# Patient Record
Sex: Female | Born: 1980 | Race: Black or African American | Hispanic: No | Marital: Married | Smoking: Never smoker
Health system: Southern US, Community
[De-identification: ages and names within clinical notes are randomized; demographics above are authoritative.]

## PROBLEM LIST (undated history)

## (undated) DIAGNOSIS — I10 Essential (primary) hypertension: Secondary | ICD-10-CM

## (undated) HISTORY — PX: DERMOID CYST  EXCISION: SHX1452

## (undated) HISTORY — PX: MR HIPS BILATERAL: HXRAD169

---
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
## 2001-08-26 ENCOUNTER — Inpatient Hospital Stay (HOSPITAL_COMMUNITY): Admission: AD | Admit: 2001-08-26 | Discharge: 2001-08-28 | Payer: Self-pay | Admitting: Obstetrics and Gynecology

## 2002-09-18 ENCOUNTER — Other Ambulatory Visit: Admission: RE | Admit: 2002-09-18 | Discharge: 2002-09-18 | Payer: Self-pay | Admitting: Obstetrics and Gynecology

## 2003-10-17 ENCOUNTER — Emergency Department (HOSPITAL_COMMUNITY): Admission: EM | Admit: 2003-10-17 | Discharge: 2003-10-17 | Payer: Self-pay | Admitting: Family Medicine

## 2003-12-05 ENCOUNTER — Other Ambulatory Visit: Admission: RE | Admit: 2003-12-05 | Discharge: 2003-12-05 | Payer: Self-pay | Admitting: Obstetrics and Gynecology

## 2004-02-01 ENCOUNTER — Emergency Department (HOSPITAL_COMMUNITY): Admission: EM | Admit: 2004-02-01 | Discharge: 2004-02-01 | Payer: Self-pay | Admitting: Family Medicine

## 2004-02-14 ENCOUNTER — Emergency Department (HOSPITAL_COMMUNITY): Admission: EM | Admit: 2004-02-14 | Discharge: 2004-02-14 | Payer: Self-pay | Admitting: Emergency Medicine

## 2004-02-15 ENCOUNTER — Emergency Department (HOSPITAL_COMMUNITY): Admission: EM | Admit: 2004-02-15 | Discharge: 2004-02-15 | Payer: Self-pay | Admitting: Emergency Medicine

## 2005-03-18 ENCOUNTER — Emergency Department (HOSPITAL_COMMUNITY): Admission: EM | Admit: 2005-03-18 | Discharge: 2005-03-18 | Payer: Self-pay | Admitting: Family Medicine

## 2005-09-09 ENCOUNTER — Emergency Department (HOSPITAL_COMMUNITY): Admission: EM | Admit: 2005-09-09 | Discharge: 2005-09-09 | Payer: Self-pay | Admitting: Family Medicine

## 2007-10-28 ENCOUNTER — Inpatient Hospital Stay (HOSPITAL_COMMUNITY): Admission: AD | Admit: 2007-10-28 | Discharge: 2007-10-28 | Payer: Self-pay | Admitting: Obstetrics and Gynecology

## 2007-10-30 ENCOUNTER — Encounter: Admission: RE | Admit: 2007-10-30 | Discharge: 2007-10-30 | Payer: Self-pay | Admitting: Obstetrics and Gynecology

## 2007-12-15 ENCOUNTER — Inpatient Hospital Stay (HOSPITAL_COMMUNITY): Admission: AD | Admit: 2007-12-15 | Discharge: 2007-12-16 | Payer: Self-pay | Admitting: Obstetrics and Gynecology

## 2007-12-18 ENCOUNTER — Inpatient Hospital Stay (HOSPITAL_COMMUNITY): Admission: AD | Admit: 2007-12-18 | Discharge: 2007-12-18 | Payer: Self-pay | Admitting: Obstetrics and Gynecology

## 2008-03-13 ENCOUNTER — Inpatient Hospital Stay (HOSPITAL_COMMUNITY): Admission: AD | Admit: 2008-03-13 | Discharge: 2008-03-13 | Payer: Self-pay | Admitting: Obstetrics and Gynecology

## 2008-04-18 ENCOUNTER — Inpatient Hospital Stay (HOSPITAL_COMMUNITY): Admission: AD | Admit: 2008-04-18 | Discharge: 2008-04-18 | Payer: Self-pay | Admitting: Obstetrics and Gynecology

## 2008-04-23 ENCOUNTER — Inpatient Hospital Stay (HOSPITAL_COMMUNITY): Admission: AD | Admit: 2008-04-23 | Discharge: 2008-04-25 | Payer: Self-pay | Admitting: Obstetrics and Gynecology

## 2008-09-09 ENCOUNTER — Encounter: Admission: RE | Admit: 2008-09-09 | Discharge: 2008-09-09 | Payer: Self-pay | Admitting: Obstetrics and Gynecology

## 2010-09-21 NOTE — H&P (Signed)
[name], [name]                  ACCOUNT NO.:  [id number]   MEDICAL RECORD NO.:  [id number]          PATIENT TYPE:  INP   LOCATION:  9135                          FACILITY:  [facility]   PHYSICIAN:  Hal Morales, M.D.DATE OF BIRTH:  Nov 21, 1980   DATE OF ADMISSION:  04/23/2008  DATE OF DISCHARGE:                              HISTORY & PHYSICAL   HISTORY OF PRESENT ILLNESS:  Brenda Johns is a 30 year old G2, P 1-0-0-1 at  24 weeks who presents today with report of increased contractions and  pelvic pressure and pain.  She has been followed by the Midwifery  Service at [facility] and Gynecology.  Her pregnancy is remarkable for,  1. History of anemia.  2. 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  3. Polydactyly  4. Obesity with current weight of 296 pounds.  5. Uterine fibroids and dermoids.   PRENATAL LABS:  When she entered the practice in May, her hemoglobin was  11.3, her hematocrit was 34.1, and platelets 230,000.  Blood type B  positive, antibody screen negative, sickle cell trait screen negative,  RPR nonreactive, hepatitis B negative, and HIV nonreactive.  Pap was  normal.  Gonorrhea and Chlamydia cultures were negative.  At 27 weeks 5  days, she had a Glucola and 1-hour Glucola that was 134, at that time  her hemoglobin was 9.6 and she was begun on iron.  Her beta strep test  was negative on April 01, 2008, at 35 weeks and 6 days.  Brenda Johns  entered prenatal care at [facility] beginning with an interview  in the beginning of May, on Sep 13, 2007 and was started on prenatal  vitamins at that time.  On Oct 04, 2007 she had her new OB exam and she  was 10 weeks at that time.  She began to have a lot of pain in her  pelvis at the beginning of second trimester in June.  Third week of  June, she was seen in the MAU at the hospital for pain and cramping, had  an ultrasound that showed intrauterine pregnancy and abdominal masses,  an adnexal complex, and an umbilical complex.   She had an MRI, to follow  up the same and some consultation was done.  Her pain proceeded to  worsen and then at the end of July, she was taking narcotics, oxycodone.  The masses were getting larger.  She was referred out to Dr. April Manson  by Dr. Pennie Rushing and she had  robotic surgery to remove dermoid cysts that  measured 12 cm x 7 cm.  By the middle of August, she was feeling much  better after the cystectomy, she did have some cramping and a negative  fetal fibronectin, that was done.  In the beginning of September, she  had another ultrasound secondary to size greater than dates and the  growth showed size consistent with date and estimated fetal weight of 1  pound 9 ounces, cervix 5.7 cm, placenta anterior grade 1, normal fluid.  She had her 1-hour Glucola at the end of September at a shy of  28 weeks.  Normal finding of 134 mg/dL concurrent hemoglobin of 9.6.  She was  started on iron at that time.  She had another ultrasound in the middle  October, just shy of 30 weeks.  Estimated fetal weight on 4 pounds, 1  ounce greater than 90th percentile,  AFI 50th percentile.  She got her  H1N1 shot in October.  She continued to have some problems with  contractions and irritability and was taking some terbutaline every 4  hours as needed  A GBS was done on April 01, 2008 and was negative.  Over the past month, she has been having a lot of irregular contractions  and pelvic pain and has had several false alarms.   OBSTETRICAL HISTORY:  Gravida 1, was a daughter born in April 2003 at [redacted]  weeks gestation weighing 7 pounds 3 ounces after 6 hours of labor,  spontaneous vaginal delivery with no complications.  Gravida 2 is  current pregnancy.   ALLERGIES:  The patient has no known allergies.   MEDICATIONS:  She is on prenatal vitamins and iron.   MEDICAL HISTORY:  She began menstruation at age 80 with cycles occurring  every 28 days and lasting as long as 7 days with moderate-to-heavy  flow.  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   FAMILY HISTORY:  Extra digit as previously mentioned and she has a  cousin with Down syndrome.   GENETIC HISTORY:  Sickle screen negative in October, polydactyly, and  Down syndrome. No other contributing information for genetic history.   SOCIAL HISTORY:  She is married to the father of the baby who's name  [employer].  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  Father of the baby also has a high school diploma and works for  a [employer] stream[employer].  She denies the use of alcohol, tobacco, or street  drugs during the pregnancy.  Declines to stay to religious preference.  Race is African American.   PHYSICAL EXAMINATION:  VITAL SIGNS:  Stable and within normal limits.  HEENT:  Within normal limits.  LUNGS:  Clear to auscultation bilaterally.  HEART:  Regular rate and rhythm.  No murmurs heard.  BREASTS:  Soft.  ABDOMEN:  Very large gravid, large for dates with a fundal height of 43  cm.  Soft to palpation between contractions.  Fetal heart rate normal  and reassuring, reactive.  EXTREMITIES:  No edema of upper extremities, but +1 edema of bilateral  lower extremities.  DTRs +1/+1.  Negative clonus and Homans.  CERVIX:  6 cm, 60% effaced, -2 station with a bulging bag of water,  vertex presentation.   IMPRESSION:  Active labor.   PLAN:  Admit.  Routine orders.  MD consultation.  Anticipate NSVD.      Eulogio Bear, CNM      Hal Morales, M.D.  Electronically Signed    JM/MEDQ  D:  04/23/2008  T:  04/24/2008  Job:  [id number]

## 2010-09-24 NOTE — H&P (Signed)
[facility]  Patient:    Brenda Johns, Brenda Johns Visit Number: [id number] MRN: [id number]          Service Type: OBS Location: 910A 9135 01 Attending Physician:  Esmeralda Arthur Dictated by:   Saverio Danker, C.N.M. Admit Date:  08/26/2001                           History and Physical  HISTORY OF PRESENT ILLNESS:   [name] is a 30 year old single black female, gravida 1, para 0, at 39-4/7ths weeks, who presents complaining of uterine contractions every 2-3 minutes for the last 2-3 hours.  She denies any leaking, but has seen some bloody show.  She denies any nausea, vomiting, headaches or visual disturbances.  Her pregnancy has been followed at [facility] by the certified nurse midwife service and has been essentially uncomplicated, though at risk for: 1. Positive group B strep. 2. History of bilateral hip pins.  She denies an epidural for labor.  OBSTETRICAL/GYNECOLOGICAL HISTORY:                      Her LMP is November 20, 2000, giving her an [facility] of August 29, 2001, confirmed by ultrasound.  She is a primigravida.  Her other OB/GYN history: She conceived while on oral contraceptives.  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  GENERAL MEDICAL HISTORY:      She has no known drug allergies.  She reports having had the usual childhood diseases.  She reports a history of anemia and her only surgery was hip pins placed bilaterally she reports secondary to obesity.  FAMILY HISTORY:               Significant for father, maternal grandmother and paternal grandfather with hypertension; maternal grandfather with emphysema; maternal aunt and paternal grandfather with diabetes; her mother with thyroid disease.  GENETIC HISTORY:              Significant only for a cousin with clubbed feet.  SOCIAL HISTORY:               She is single.  The father of the baby is [employer].  He is involved and supportive.  She is  employed full time at [employer].  He is employed full time in [employer].  They deny any illicit drug use, alcohol or smoking with this pregnancy.  PRENATAL LABORATORIES:        Her blood type is B positive.  Her antibody screen is negative.  Sickle cell trait is negative.  Syphilis is nonreactive. Rubella is positive.  Hepatitis B surface antigen is negative.  HIV is nonreactive.  GC and Chlamydia are both negative.  Her Pap is within normal limits.  Her one-hour Glucola was 130 and maternal serum alpha-fetoprotein was within normal range and her 36-week beta strep was positive.  PHYSICAL EXAMINATION:  VITAL SIGNS:                  Her vital signs are stable, she is afebrile.  HEENT:                        Grossly within normal limits.  HEART:                        Regular rhythm and rate.  CHEST:  Clear.  BREASTS:                      Soft and nontender.  ABDOMEN:                      Gravid with uterine contractions every 2-3 minutes.  Her fetal heart rate is reassuring.  PELVIC:                       Her cervix is 5 cm, 100%, vertex at 0 station with intact membranes.  EXTREMITIES:                  Within normal limits.  ASSESSMENT:                   1. Intrauterine pregnancy at term.                               2. Active labor.                               3. Positive group B Streptococcus.                               4. Patient desires epidural.  PLAN:                         Admit to labor and delivery, to follow routine CNM orders, Dr. Estanislado Pandy has been notified of patients admission.  The patient is to receive penicillin for group B and an epidural for labor. Dictated by:   Vance Gather Duplantis, C.N.M. Attending Physician:  Esmeralda Arthur DD:  08/26/01 TD:  08/26/01 Job: [id number]

## 2010-11-04 ENCOUNTER — Other Ambulatory Visit (HOSPITAL_COMMUNITY)
Admission: RE | Admit: 2010-11-04 | Discharge: 2010-11-04 | Disposition: A | Discharge status: Home / Self Care | Payer: BC Managed Care – PPO | Source: Ambulatory Visit | Attending: Obstetrics and Gynecology | Admitting: Obstetrics and Gynecology

## 2010-11-04 ENCOUNTER — Other Ambulatory Visit: Payer: Self-pay | Admitting: Nurse Practitioner

## 2010-11-04 DIAGNOSIS — Z1159 Encounter for screening for other viral diseases: Secondary | ICD-10-CM | POA: Insufficient documentation

## 2010-11-04 DIAGNOSIS — Z124 Encounter for screening for malignant neoplasm of cervix: Secondary | ICD-10-CM | POA: Insufficient documentation

## 2011-02-03 LAB — URINE MICROSCOPIC-ADD ON

## 2011-02-03 LAB — CBC
HCT: 32.7 — ABNORMAL LOW
Hemoglobin: 11.3 — ABNORMAL LOW
MCHC: 34.6
MCV: 84.3
Platelets: 204
RBC: 3.88
RDW: 15.3
WBC: 7.3

## 2011-02-03 LAB — URINALYSIS, ROUTINE W REFLEX MICROSCOPIC
Glucose, UA: NEGATIVE
Hgb urine dipstick: NEGATIVE
Ketones, ur: 15 — AB
Leukocytes, UA: NEGATIVE
Nitrite: NEGATIVE
Protein, ur: 30 — AB
Specific Gravity, Urine: >1.03 — ABNORMAL HIGH
Urobilinogen, UA: 0.2
pH: 6.5

## 2011-02-03 LAB — BASIC METABOLIC PANEL
BUN: 4 — ABNORMAL LOW
CO2: 23
Calcium: 8.9
Chloride: 106
Creatinine, Ser: 0.47
GFR calc Af Amer: >60
GFR calc non Af Amer: >60
Glucose, Bld: 106 — ABNORMAL HIGH
Potassium: 3.8
Sodium: 136

## 2011-02-04 LAB — COMPREHENSIVE METABOLIC PANEL
ALT: 20
AST: 15
Albumin: 2.1 — ABNORMAL LOW
Alkaline Phosphatase: 51
BUN: 3 — ABNORMAL LOW
CO2: 25
Calcium: 8.1 — ABNORMAL LOW
Chloride: 107
Creatinine, Ser: 0.52
GFR calc Af Amer: >60
GFR calc non Af Amer: >60
Glucose, Bld: 81
Potassium: 3.6
Sodium: 137
Total Bilirubin: 0.5
Total Protein: 5.1 — ABNORMAL LOW

## 2011-02-04 LAB — URINE CULTURE: Colony Count: >=100000

## 2011-02-04 LAB — URINALYSIS, ROUTINE W REFLEX MICROSCOPIC
Bilirubin Urine: NEGATIVE
Bilirubin Urine: NEGATIVE
Glucose, UA: 100 — AB
Glucose, UA: NEGATIVE
Hgb urine dipstick: NEGATIVE
Hgb urine dipstick: NEGATIVE
Ketones, ur: NEGATIVE
Ketones, ur: NEGATIVE
Nitrite: NEGATIVE
Nitrite: NEGATIVE
Protein, ur: NEGATIVE
Protein, ur: NEGATIVE
Specific Gravity, Urine: 1.01
Specific Gravity, Urine: 1.02
Urobilinogen, UA: 1
Urobilinogen, UA: 2 — ABNORMAL HIGH
pH: 6.5
pH: 7

## 2011-02-04 LAB — CBC
HCT: 27.5 — ABNORMAL LOW
Hemoglobin: 9.4 — ABNORMAL LOW
MCHC: 34.1
MCV: 86.6
Platelets: 216
RBC: 3.18 — ABNORMAL LOW
RDW: 14.2
WBC: 7.4

## 2011-02-04 LAB — DIFFERENTIAL
Basophils Absolute: 0.1
Basophils Relative: 2 — ABNORMAL HIGH
Eosinophils Absolute: 0.1
Eosinophils Relative: 1
Lymphocytes Relative: 22
Lymphs Abs: 1.6
Monocytes Absolute: 0.6
Monocytes Relative: 8
Neutro Abs: 5.1
Neutrophils Relative %: 68

## 2011-02-08 LAB — URINALYSIS, ROUTINE W REFLEX MICROSCOPIC
Bilirubin Urine: NEGATIVE
Glucose, UA: NEGATIVE
Hgb urine dipstick: NEGATIVE
Ketones, ur: NEGATIVE
Nitrite: NEGATIVE
Protein, ur: NEGATIVE
Specific Gravity, Urine: 1.025
Urobilinogen, UA: 0.2
pH: 6

## 2011-02-08 LAB — FETAL FIBRONECTIN: Fetal Fibronectin: NEGATIVE

## 2011-02-08 LAB — STREP B DNA PROBE: Strep Group B Ag: NEGATIVE

## 2011-02-08 LAB — GC/CHLAMYDIA PROBE AMP, GENITAL
Chlamydia, DNA Probe: NEGATIVE
GC Probe Amp, Genital: NEGATIVE

## 2011-02-08 LAB — WET PREP, GENITAL
Clue Cells Wet Prep HPF POC: NONE SEEN
Trich, Wet Prep: NONE SEEN
Yeast Wet Prep HPF POC: NONE SEEN

## 2011-02-10 LAB — CBC
HCT: 27.9 % — ABNORMAL LOW (ref 36.0–46.0)
HCT: 30.4 % — ABNORMAL LOW (ref 36.0–46.0)
Hemoglobin: 10.4 g/dL — ABNORMAL LOW (ref 12.0–15.0)
Hemoglobin: 9.4 g/dL — ABNORMAL LOW (ref 12.0–15.0)
MCHC: 33.6 g/dL (ref 30.0–36.0)
MCHC: 34.2 g/dL (ref 30.0–36.0)
MCV: 79.1 fL (ref 78.0–100.0)
MCV: 79.8 fL (ref 78.0–100.0)
Platelets: 223 10*3/uL (ref 150–400)
Platelets: 247 10*3/uL (ref 150–400)
RBC: 3.5 MIL/uL — ABNORMAL LOW (ref 3.87–5.11)
RBC: 3.84 MIL/uL — ABNORMAL LOW (ref 3.87–5.11)
RDW: 15.5 % (ref 11.5–15.5)
RDW: 15.5 % (ref 11.5–15.5)
WBC: 11.7 10*3/uL — ABNORMAL HIGH (ref 4.0–10.5)
WBC: 6.7 10*3/uL (ref 4.0–10.5)

## 2011-02-10 LAB — RPR: RPR Ser Ql: NONREACTIVE

## 2011-03-01 ENCOUNTER — Encounter: Payer: BC Managed Care – PPO | Admitting: Hematology and Oncology

## 2011-03-10 ENCOUNTER — Telehealth: Payer: Self-pay | Admitting: Hematology and Oncology

## 2011-03-10 NOTE — Telephone Encounter (Signed)
Pt work phone number is [phone].

## 2011-03-10 NOTE — Telephone Encounter (Signed)
S/w pt today re appt for 11/6 @ 2:15 pm w/LO.

## 2011-03-15 ENCOUNTER — Ambulatory Visit: Payer: BC Managed Care – PPO | Admitting: Hematology and Oncology

## 2011-03-15 ENCOUNTER — Ambulatory Visit: Payer: BC Managed Care – PPO

## 2013-02-19 ENCOUNTER — Encounter (HOSPITAL_COMMUNITY): Payer: Self-pay | Admitting: Emergency Medicine

## 2013-02-19 ENCOUNTER — Emergency Department (INDEPENDENT_AMBULATORY_CARE_PROVIDER_SITE_OTHER)
Admission: EM | Admit: 2013-02-19 | Discharge: 2013-02-19 | Disposition: A | Discharge status: Home / Self Care | Payer: Self-pay | Source: Home / Self Care | Attending: Family Medicine | Admitting: Family Medicine

## 2013-02-19 DIAGNOSIS — J069 Acute upper respiratory infection, unspecified: Secondary | ICD-10-CM

## 2013-02-19 MED ORDER — HYDROCOD POLST-CHLORPHEN POLST 10-8 MG/5ML PO LQCR: 5.0000 mL | Freq: Two times a day (BID) | ORAL | Status: DC | PRN

## 2013-02-19 MED ORDER — IPRATROPIUM BROMIDE 0.06 % NA SOLN: 2.0000 | Freq: Four times a day (QID) | NASAL | Status: DC

## 2013-02-19 NOTE — ED Notes (Signed)
Coughing, congestion, headache, chest congestion and pain, dark green phlegm, and unsure if she has run a fever, but has had chills.  Onset of symptoms 2 weeks ago

## 2013-02-19 NOTE — ED Notes (Signed)
Patient requesting work note.

## 2013-02-19 NOTE — Discharge Instructions (Signed)
Drink plenty of fluids as discussed, use medicine as prescribed, and mucinex or delsym for cough. Return or see your doctor if further problems °

## 2013-02-19 NOTE — ED Provider Notes (Signed)
CSN: [id number]     Arrival date & time 02/19/13  1914 History   First MD Initiated Contact with Patient 02/19/13 [phone]     Chief Complaint  Patient presents with  . URI   (Consider location/radiation/quality/duration/timing/severity/associated sxs/prior Treatment) Patient is a 32 y.o. female presenting with URI. The history is provided by the patient.  URI Presenting symptoms: congestion, cough and rhinorrhea   Severity:  Mild Onset quality:  Gradual Duration:  2 days Progression:  Unchanged Chronicity:  Recurrent (sick 2 weeks ago improved then kids got sick and she got sick again on sun.) Relieved by:  Nothing Ineffective treatments:  OTC medications Risk factors: sick contacts     History reviewed. No pertinent past medical history. History reviewed. No pertinent past surgical history. No family history on file. History  Substance Use Topics  . Smoking status: Never Smoker   . Smokeless tobacco: Not on file  . Alcohol Use: Yes   OB History   Grav Para Term Preterm Abortions TAB SAB Ect Mult Living                 Review of Systems  Constitutional: Negative.   HENT: Positive for congestion and rhinorrhea.   Respiratory: Positive for cough.   Gastrointestinal: Negative.   Genitourinary: Negative.     Allergies  Review of patient's allergies indicates no known allergies.  Home Medications   Current Outpatient Rx  Name  Route  Sig  Dispense  Refill  . guaiFENesin (MUCINEX) 600 MG 12 hr tablet   Oral   Take 1,200 mg by mouth 2 (two) times daily.         Marland Kitchen OVER THE COUNTER MEDICATION      Sinus medicine, nose spray         . chlorpheniramine-HYDROcodone (TUSSIONEX PENNKINETIC ER) 10-8 MG/5ML LQCR   Oral   Take 5 mLs by mouth every 12 (twelve) hours as needed. For cough   115 mL   0   . ipratropium (ATROVENT) 0.06 % nasal spray   Nasal   Place 2 sprays into the nose 4 (four) times daily.   15 mL   1    BP 117/63  Pulse 71  Temp(Src) 98 F  (36.7 C) (Oral)  Resp 22  SpO2 98%  LMP 02/19/2013 Physical Exam  Nursing note and vitals reviewed. Constitutional: She is oriented to person, place, and time. She appears well-developed and well-nourished.  HENT:  Head: Normocephalic.  Right Ear: External ear normal.  Left Ear: External ear normal.  Nose: Mucosal edema and rhinorrhea present.  Mouth/Throat: Oropharynx is clear and moist.  Eyes: Conjunctivae and EOM are normal. Pupils are equal, round, and reactive to light.  Neck: Normal range of motion. Neck supple.  Cardiovascular: Normal rate.   Pulmonary/Chest: Effort normal and breath sounds normal.  Abdominal: Soft. Bowel sounds are normal. There is no tenderness.  Neurological: She is alert and oriented to person, place, and time.  Skin: Skin is warm and dry.    ED Course  Procedures (including critical care time) Labs Review Labs Reviewed - No data to display Imaging Review No results found.    MDM      Linna Hoff, MD 02/19/13 [phone]

## 2013-12-31 ENCOUNTER — Inpatient Hospital Stay (HOSPITAL_COMMUNITY)
Admission: AD | Admit: 2013-12-31 | Discharge: 2014-01-01 | Disposition: A | Discharge status: Home / Self Care | Payer: 59 | Source: Ambulatory Visit | Attending: Obstetrics and Gynecology | Admitting: Obstetrics and Gynecology

## 2013-12-31 ENCOUNTER — Encounter (HOSPITAL_COMMUNITY): Payer: Self-pay | Admitting: *Deleted

## 2013-12-31 DIAGNOSIS — N926 Irregular menstruation, unspecified: Secondary | ICD-10-CM | POA: Insufficient documentation

## 2013-12-31 DIAGNOSIS — N921 Excessive and frequent menstruation with irregular cycle: Secondary | ICD-10-CM

## 2013-12-31 DIAGNOSIS — N920 Excessive and frequent menstruation with regular cycle: Secondary | ICD-10-CM | POA: Insufficient documentation

## 2013-12-31 LAB — URINE MICROSCOPIC-ADD ON

## 2013-12-31 LAB — CBC
HCT: 31.5 % — ABNORMAL LOW (ref 36.0–46.0)
Hemoglobin: 9.5 g/dL — ABNORMAL LOW (ref 12.0–15.0)
MCH: 21.7 pg — ABNORMAL LOW (ref 26.0–34.0)
MCHC: 30.2 g/dL (ref 30.0–36.0)
MCV: 72.1 fL — ABNORMAL LOW (ref 78.0–100.0)
Platelets: 257 K/uL (ref 150–400)
RBC: 4.37 MIL/uL (ref 3.87–5.11)
RDW: 17.8 % — ABNORMAL HIGH (ref 11.5–15.5)
WBC: 7.5 K/uL (ref 4.0–10.5)

## 2013-12-31 LAB — URINALYSIS, ROUTINE W REFLEX MICROSCOPIC
Bilirubin Urine: NEGATIVE
Glucose, UA: NEGATIVE mg/dL
Ketones, ur: NEGATIVE mg/dL
Leukocytes, UA: NEGATIVE
Nitrite: NEGATIVE
Protein, ur: 30 mg/dL — AB
Specific Gravity, Urine: >1.03 — ABNORMAL HIGH (ref 1.005–1.030)
Urobilinogen, UA: 0.2 mg/dL (ref 0.0–1.0)
pH: 5.5 (ref 5.0–8.0)

## 2013-12-31 LAB — POCT PREGNANCY, URINE: Preg Test, Ur: NEGATIVE

## 2013-12-31 NOTE — MAU Note (Signed)
Pt states she has been having heavy bleeding since yesterday. Heavy clotting started tonight at about 2000. Pt states she has had a Mirena since 12/14. Pt states she has been passing clots and has been feeling short,pt states it is not has bad "i just feel sleepy now."

## 2013-12-31 NOTE — MAU Note (Signed)
PT SAYS SHE HAD AN TAB ON  06-2012-  IN [place] - GAVE HER A PILL.    THEN  WENT TO  DR Colan Neptune- FOR FOLLOW-UP IN DEC-2014- AND RECEIVED  MIRENA.     SHE HAS BEEN HAVING IRREG BLEEDING-    IS ALSO ON BCP-   STARTED IN 09-2013.       STARTED FEELING SICK  AFTER 3 DAYS-   SO SHE STOPPED .      SHE NEVER HAD REG CYCLE-  WITH LESS  HEAVY BLEEDING.      ALL SAME UNTIL YESTERDAY-   THEN    YESTERDAY AT 0500-  SHE FELT SQUIRTING IN B-ROOM-  ALL BLOOD -   CRAMPING THEN THAT STOPPED .    THEN AT 815AM-   SAW BLOOD IN CAR   SEAT- WENT BACK HOME-    WAS SPOTTING- INSERTED TAMPON.    HAS SPOTTED ALL YESTERDAY AND TODAY         AT 8PM TONIGHT - FELT SQUIRTING AND SAW  BLOOD CLOT - SIZE OF  FIST.     THEN  WHEN GOT HERE - WENT TO B-ROOM  AND PASSED  LARGER  BLOOD CLOT.    .   IN TRIAGE-  PAD-   LIGHT SMEARS  FROM PERINUEM.            HPT-   ON 11-26-2013  - NEG.      TONIGHT COULD NOT FEEL STRING FROM MIRENA.

## 2013-12-31 NOTE — MAU Provider Note (Signed)
History     CSN: [id number]  Arrival date and time: 12/31/13 2118   First Provider Initiated Contact with Patient 12/31/13 2329      Chief Complaint  Patient presents with  . Vaginal Bleeding   Vaginal Bleeding    Brenda Johns is a 33 y.o. [id number] who presents today with vaginal bleeding. She has had a mirena, and had has spotting off and on since it was inserted. She states that two days she started to have heavy bleeding, and then it stopped. She had only spotting yesterday. Then today she started to have heavy bleeding again. She states that she has been passing large clots the size of her fist. She states that she only has pain when she is passing a clot. Otherwise she doesn't;t have pain. She states that she was able to feel her IUD string on Friday, but today after all the bleeding she has not been able to feel her string.   History reviewed. No pertinent past medical history.  Past Surgical History  Procedure Laterality Date  . Mr hips bilateral    . Dermoid cyst  excision      Family History  Problem Relation Age of Onset  . Thyroid disease Mother   . Hypertension Father   . Diverticulosis Father     History  Substance Use Topics  . Smoking status: Never Smoker   . Smokeless tobacco: Not on file  . Alcohol Use: Yes    Allergies: No Known Allergies  Prescriptions prior to admission  Medication Sig Dispense Refill  . chlorpheniramine-HYDROcodone (TUSSIONEX PENNKINETIC ER) 10-8 MG/5ML LQCR Take 5 mLs by mouth every 12 (twelve) hours as needed. For cough  115 mL  0  . guaiFENesin (MUCINEX) 600 MG 12 hr tablet Take 1,200 mg by mouth 2 (two) times daily.      Marland Kitchen ipratropium (ATROVENT) 0.06 % nasal spray Place 2 sprays into the nose 4 (four) times daily.  15 mL  1  . OVER THE COUNTER MEDICATION Sinus medicine, nose spray        Review of Systems  Genitourinary: Positive for vaginal bleeding.   Physical Exam   Blood pressure 121/74, pulse 82, temperature 98.7  F (37.1 C), temperature source Oral, resp. rate 18, height 5\' 7"  (1.702 m), weight 127.574 kg (281 lb 4 oz), last menstrual period 12/05/2013, SpO2 100.00%.  Physical Exam  Nursing note and vitals reviewed. Constitutional: She is oriented to person, place, and time. She appears well-developed and well-nourished. No distress.  Cardiovascular: Normal rate.   Respiratory: Effort normal.  GI: Soft. There is no tenderness. There is no rebound.  Genitourinary:   External: no lesion Vagina: large amount of blood with a clot the size of a fist in the vagina.  Cervix: pink, smooth, no CMT, unable to visualize IUD tail Uterus: slightly enlarged  Adnexa: NT   Neurological: She is alert and oriented to person, place, and time.  Skin: Skin is warm and dry.  Psychiatric: She has a normal mood and affect.    MAU Course  Procedures  Results for orders placed during the hospital encounter of 12/31/13 (from the past 24 hour(s))  POCT PREGNANCY, URINE     Status: None   Collection Time    12/31/13 10:05 PM      Result Value Ref Range   Preg Test, Ur NEGATIVE  NEGATIVE  CBC     Status: Abnormal   Collection Time    12/31/13 10:20 PM  Result Value Ref Range   WBC 7.5  4.0 - 10.5 K/uL   RBC 4.37  3.87 - 5.11 MIL/uL   Hemoglobin 9.5 (*) 12.0 - 15.0 g/dL   HCT 31.5 (*) 36.0 - 46.0 %   MCV 72.1 (*) 78.0 - 100.0 fL   MCH 21.7 (*) 26.0 - 34.0 pg   MCHC 30.2  30.0 - 36.0 g/dL   RDW 17.8 (*) 11.5 - 15.5 %   Platelets 257  150 - 400 K/uL  URINALYSIS, ROUTINE W REFLEX MICROSCOPIC     Status: Abnormal   Collection Time    12/31/13 10:30 PM      Result Value Ref Range   Color, Urine ORANGE (*) YELLOW   APPearance HAZY (*) CLEAR   Specific Gravity, Urine >1.030 (*) 1.005 - 1.030   pH 5.5  5.0 - 8.0   Glucose, UA NEGATIVE  NEGATIVE mg/dL   Hgb urine dipstick LARGE (*) NEGATIVE   Bilirubin Urine NEGATIVE  NEGATIVE   Ketones, ur NEGATIVE  NEGATIVE mg/dL   Protein, ur 30 (*) NEGATIVE mg/dL    Urobilinogen, UA 0.2  0.0 - 1.0 mg/dL   Nitrite NEGATIVE  NEGATIVE   Leukocytes, UA NEGATIVE  NEGATIVE  URINE MICROSCOPIC-ADD ON     Status: Abnormal   Collection Time    12/31/13 10:30 PM      Result Value Ref Range   Squamous Epithelial / LPF RARE  RARE   WBC, UA 0-2  <3 WBC/hpf   RBC / HPF TOO NUMEROUS TO COUNT  <3 RBC/hpf   Bacteria, UA FEW (*) RARE  WET PREP, GENITAL     Status: Abnormal   Collection Time    12/31/13 11:43 PM      Result Value Ref Range   Yeast Wet Prep HPF POC NONE SEEN  NONE SEEN   Trich, Wet Prep NONE SEEN  NONE SEEN   Clue Cells Wet Prep HPF POC NONE SEEN  NONE SEEN   WBC, Wet Prep HPF POC FEW (*) NONE SEEN   US Transvaginal Non-ob  01/01/2014   CLINICAL DATA:  Heavy vaginal bleeding. Unable to visualize intrauterine device strings.  EXAM: TRANSABDOMINAL AND TRANSVAGINAL ULTRASOUND OF PELVIS  TECHNIQUE: Both transabdominal and transvaginal ultrasound examinations of the pelvis were performed. Transabdominal technique was performed for global imaging of the pelvis including uterus, ovaries, adnexal regions, and pelvic cul-de-sac. It was necessary to proceed with endovaginal exam following the transabdominal exam to visualize the ovaries and better visualize the endometrial stripe.  COMPARISON:  None  FINDINGS: Uterus  Measurements: 11.0 x 7.0 x 6.8 cm. 1.9 x 1.4 x 1.2 cm posterior myometrial mass, well away from the endometrium. 1.2 x 1.1 x 1.1 cm similar-appearing mass anteriorly, also not in contact with the endometrium.  Endometrium  Thickness: 7.9 mm.  Normal appearance.  No intrauterine device seen.  Right ovary  Measurements: 2.4 x 1.8 x 1.7 cm. Normal appearance/no adnexal mass.  Left ovary  Measurements: 2.9 x 2.2 x 2.1 cm. Normal appearance/no adnexal mass.  Other findings  No free fluid.  IMPRESSION: 1. Two small uterine fibroids. Neither of these is submucosal in location. 2. No intrauterine device visualized.   Electronically Signed   By: Enrique Sack M.D.    On: 01/01/2014 01:04   US Pelvis Complete  01/01/2014   CLINICAL DATA:  Heavy vaginal bleeding. Unable to visualize intrauterine device strings.  EXAM: TRANSABDOMINAL AND TRANSVAGINAL ULTRASOUND OF PELVIS  TECHNIQUE: Both transabdominal and transvaginal ultrasound  examinations of the pelvis were performed. Transabdominal technique was performed for global imaging of the pelvis including uterus, ovaries, adnexal regions, and pelvic cul-de-sac. It was necessary to proceed with endovaginal exam following the transabdominal exam to visualize the ovaries and better visualize the endometrial stripe.  COMPARISON:  None  FINDINGS: Uterus  Measurements: 11.0 x 7.0 x 6.8 cm. 1.9 x 1.4 x 1.2 cm posterior myometrial mass, well away from the endometrium. 1.2 x 1.1 x 1.1 cm similar-appearing mass anteriorly, also not in contact with the endometrium.  Endometrium  Thickness: 7.9 mm.  Normal appearance.  No intrauterine device seen.  Right ovary  Measurements: 2.4 x 1.8 x 1.7 cm. Normal appearance/no adnexal mass.  Left ovary  Measurements: 2.9 x 2.2 x 2.1 cm. Normal appearance/no adnexal mass.  Other findings  No free fluid.  IMPRESSION: 1. Two small uterine fibroids. Neither of these is submucosal in location. 2. No intrauterine device visualized.   Electronically Signed   By: Enrique Sack M.D.   On: 01/01/2014 01:04  0122: D/W Dr. Simona Huh. Will start provera 10mg  #10. Also recommended iron supplementation.   Assessment and Plan   1. Menorrhagia with irregular cycle    Bleeding precautions Start provera 10mg  daily for 10 days Iron supplementation Return to MAU as needed  Follow-up Information   Follow up with Thurnell Lose, MD. (Make an appointment to be seen this week in the office )    Specialty:  Obstetrics and Gynecology   Contact information:   [address] [place] [address] [phone]        Mathis Bud 12/31/2013, 11:41 PM

## 2014-01-01 ENCOUNTER — Inpatient Hospital Stay (HOSPITAL_COMMUNITY): Payer: 59

## 2014-01-01 DIAGNOSIS — N920 Excessive and frequent menstruation with regular cycle: Secondary | ICD-10-CM

## 2014-01-01 LAB — GC/CHLAMYDIA PROBE AMP
CT Probe RNA: NEGATIVE
GC Probe RNA: NEGATIVE

## 2014-01-01 LAB — WET PREP, GENITAL
Clue Cells Wet Prep HPF POC: NONE SEEN
Trich, Wet Prep: NONE SEEN
Yeast Wet Prep HPF POC: NONE SEEN

## 2014-01-01 MED ORDER — MEDROXYPROGESTERONE ACETATE 10 MG PO TABS: 10.0000 mg | ORAL_TABLET | Freq: Every day | ORAL | Status: DC

## 2014-01-01 NOTE — Progress Notes (Signed)
Pt has large amount of bleeding with clots

## 2014-01-01 NOTE — Discharge Instructions (Signed)
Abnormal Uterine Bleeding Abnormal uterine bleeding can affect women at various stages in life, including teenagers, women in their reproductive years, pregnant women, and women who have reached menopause. Several kinds of uterine bleeding are considered abnormal, including:  Bleeding or spotting between periods.   Bleeding after sexual intercourse.   Bleeding that is heavier or more than normal.   Periods that last longer than usual.  Bleeding after menopause.  Many cases of abnormal uterine bleeding are minor and simple to treat, while others are more serious. Any type of abnormal bleeding should be evaluated by your health care provider. Treatment will depend on the cause of the bleeding. HOME CARE INSTRUCTIONS Monitor your condition for any changes. The following actions may help to alleviate any discomfort you are experiencing:  Avoid the use of tampons and douches as directed by your health care provider.  Change your pads frequently. You should get regular pelvic exams and Pap tests. Keep all follow-up appointments for diagnostic tests as directed by your health care provider.  SEEK MEDICAL CARE IF:   Your bleeding lasts more than 1 week.   You feel dizzy at times.  SEEK IMMEDIATE MEDICAL CARE IF:   You pass out.   You are changing pads every 15 to 30 minutes.   You have abdominal pain.  You have a fever.   You become sweaty or weak.   You are passing large blood clots from the vagina.   You start to feel nauseous and vomit. MAKE SURE YOU:   Understand these instructions.  Will watch your condition.  Will get help right away if you are not doing well or get worse. Document Released: 04/25/2005 Document Revised: 04/30/2013 Document Reviewed: 11/22/2012 Exit[employer] Patient Information 2015 [employer]. This information is not intended to replace advice given to you by your health care provider. Make sure you discuss any questions you have with your  health care provider.  

## 2014-01-02 ENCOUNTER — Other Ambulatory Visit: Payer: Self-pay | Admitting: Obstetrics and Gynecology

## 2014-03-10 ENCOUNTER — Encounter (HOSPITAL_COMMUNITY): Payer: Self-pay | Admitting: *Deleted

## 2014-07-02 ENCOUNTER — Other Ambulatory Visit: Payer: Self-pay | Admitting: Advanced Practice Midwife

## 2015-12-08 ENCOUNTER — Other Ambulatory Visit: Payer: Self-pay | Admitting: Nurse Practitioner

## 2015-12-08 ENCOUNTER — Ambulatory Visit
Admission: RE | Admit: 2015-12-08 | Discharge: 2015-12-08 | Disposition: A | Discharge status: Home / Self Care | Payer: 59 | Source: Ambulatory Visit | Attending: Nurse Practitioner | Admitting: Nurse Practitioner

## 2015-12-08 DIAGNOSIS — M545 Low back pain, unspecified: Secondary | ICD-10-CM

## 2015-12-08 DIAGNOSIS — M25552 Pain in left hip: Secondary | ICD-10-CM

## 2016-11-26 DIAGNOSIS — Z0100 Encounter for examination of eyes and vision without abnormal findings: Secondary | ICD-10-CM | POA: Diagnosis not present

## 2017-04-11 IMAGING — CR DG LUMBAR SPINE 2-3V
3 series · 3 of 3 positions shown · non-contrast
Comparison: None.

CLINICAL DATA: Low back and left posterior hip pain 4-5 days. No
injury.

EXAM:
LUMBAR SPINE - 2-3 VIEW

[t l-spine a.p.]
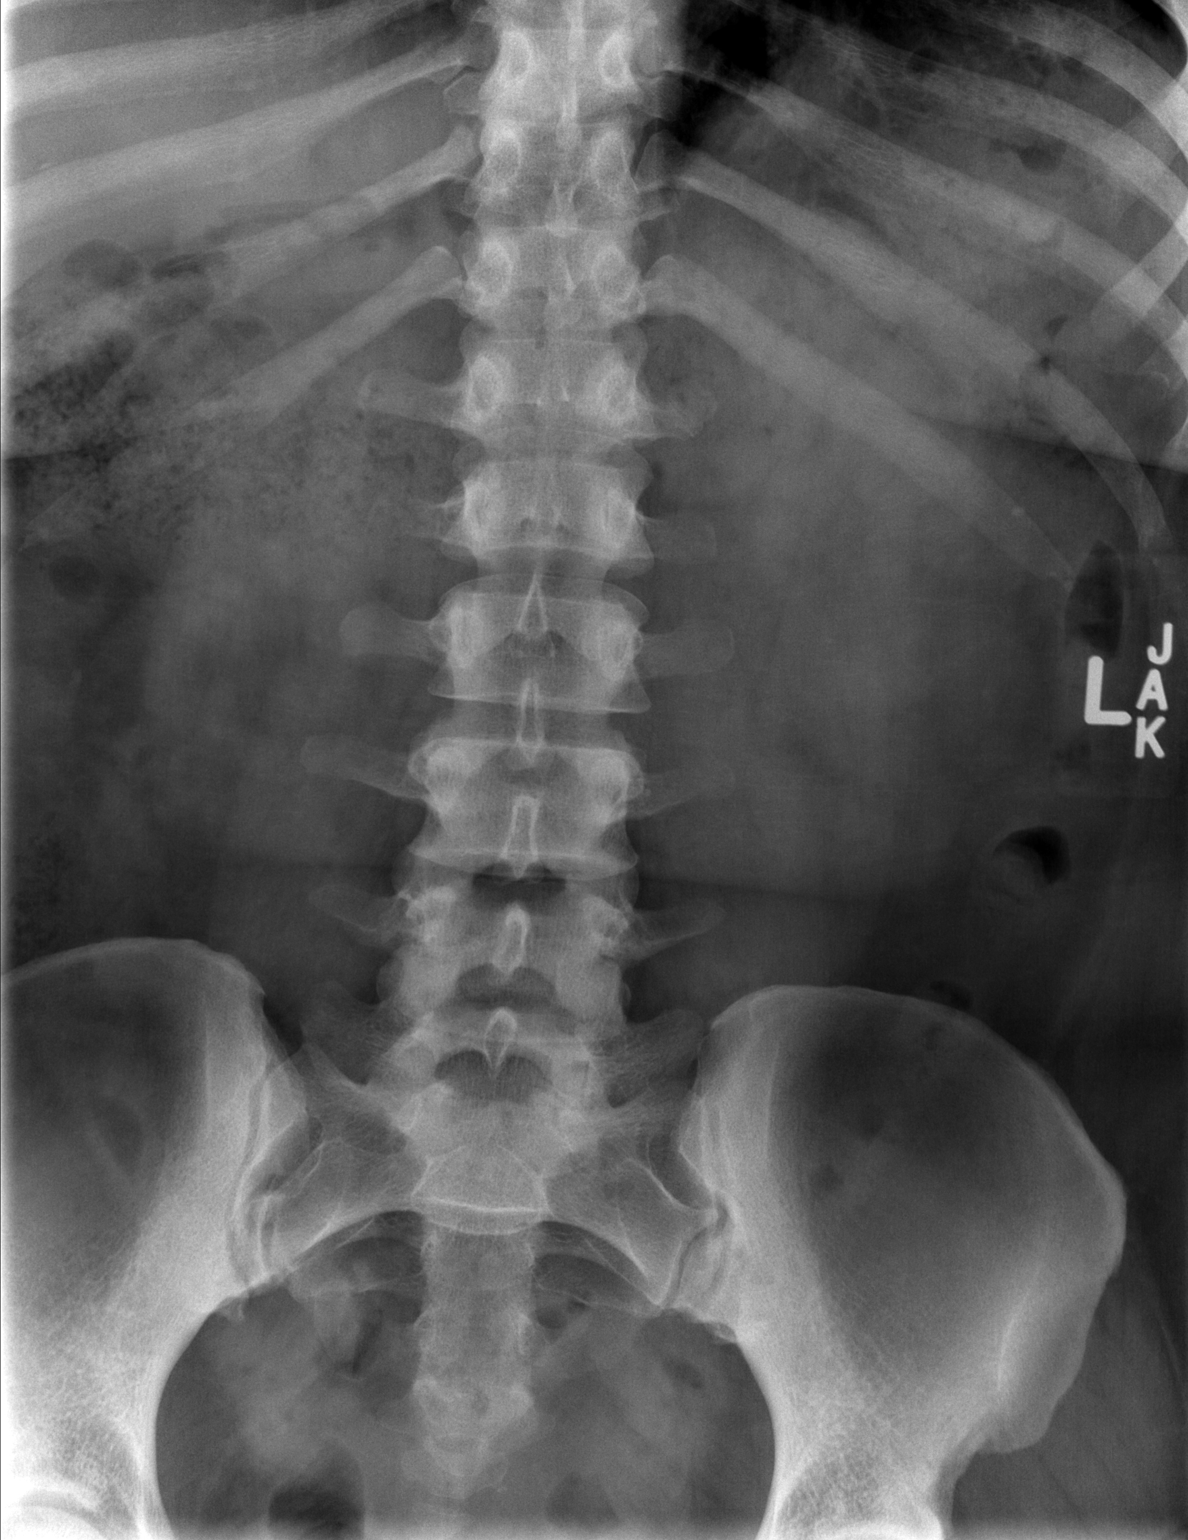

[t l-spine lat]
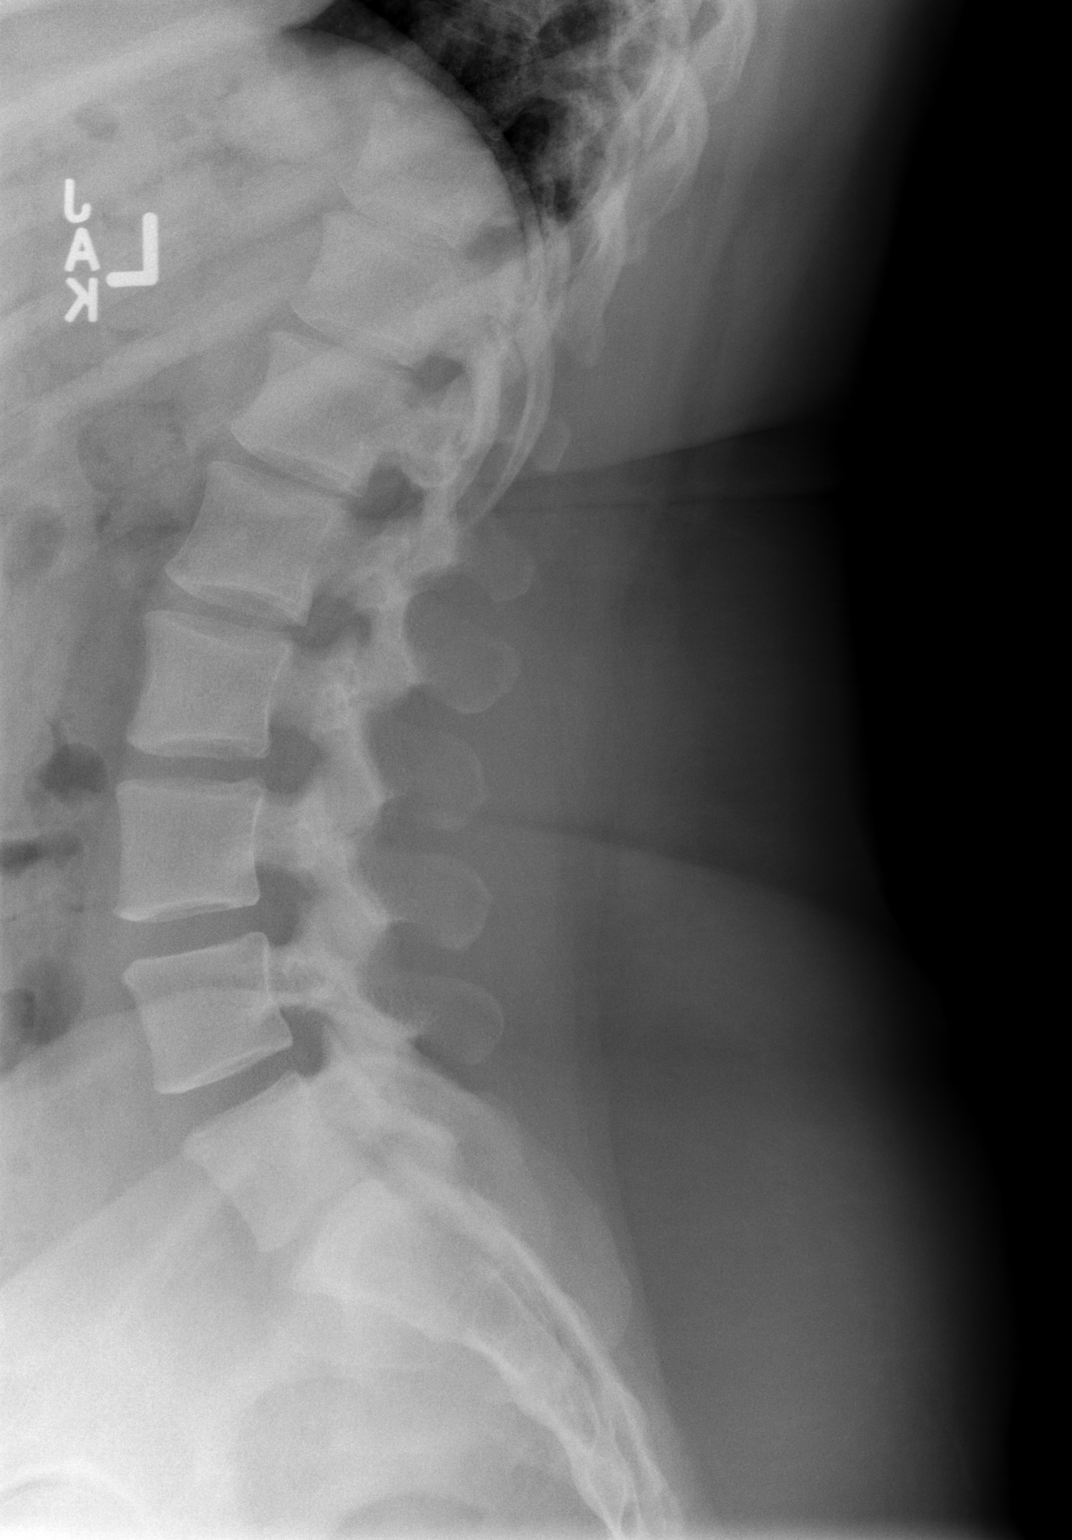

[t l-spine l5-s1 spot]
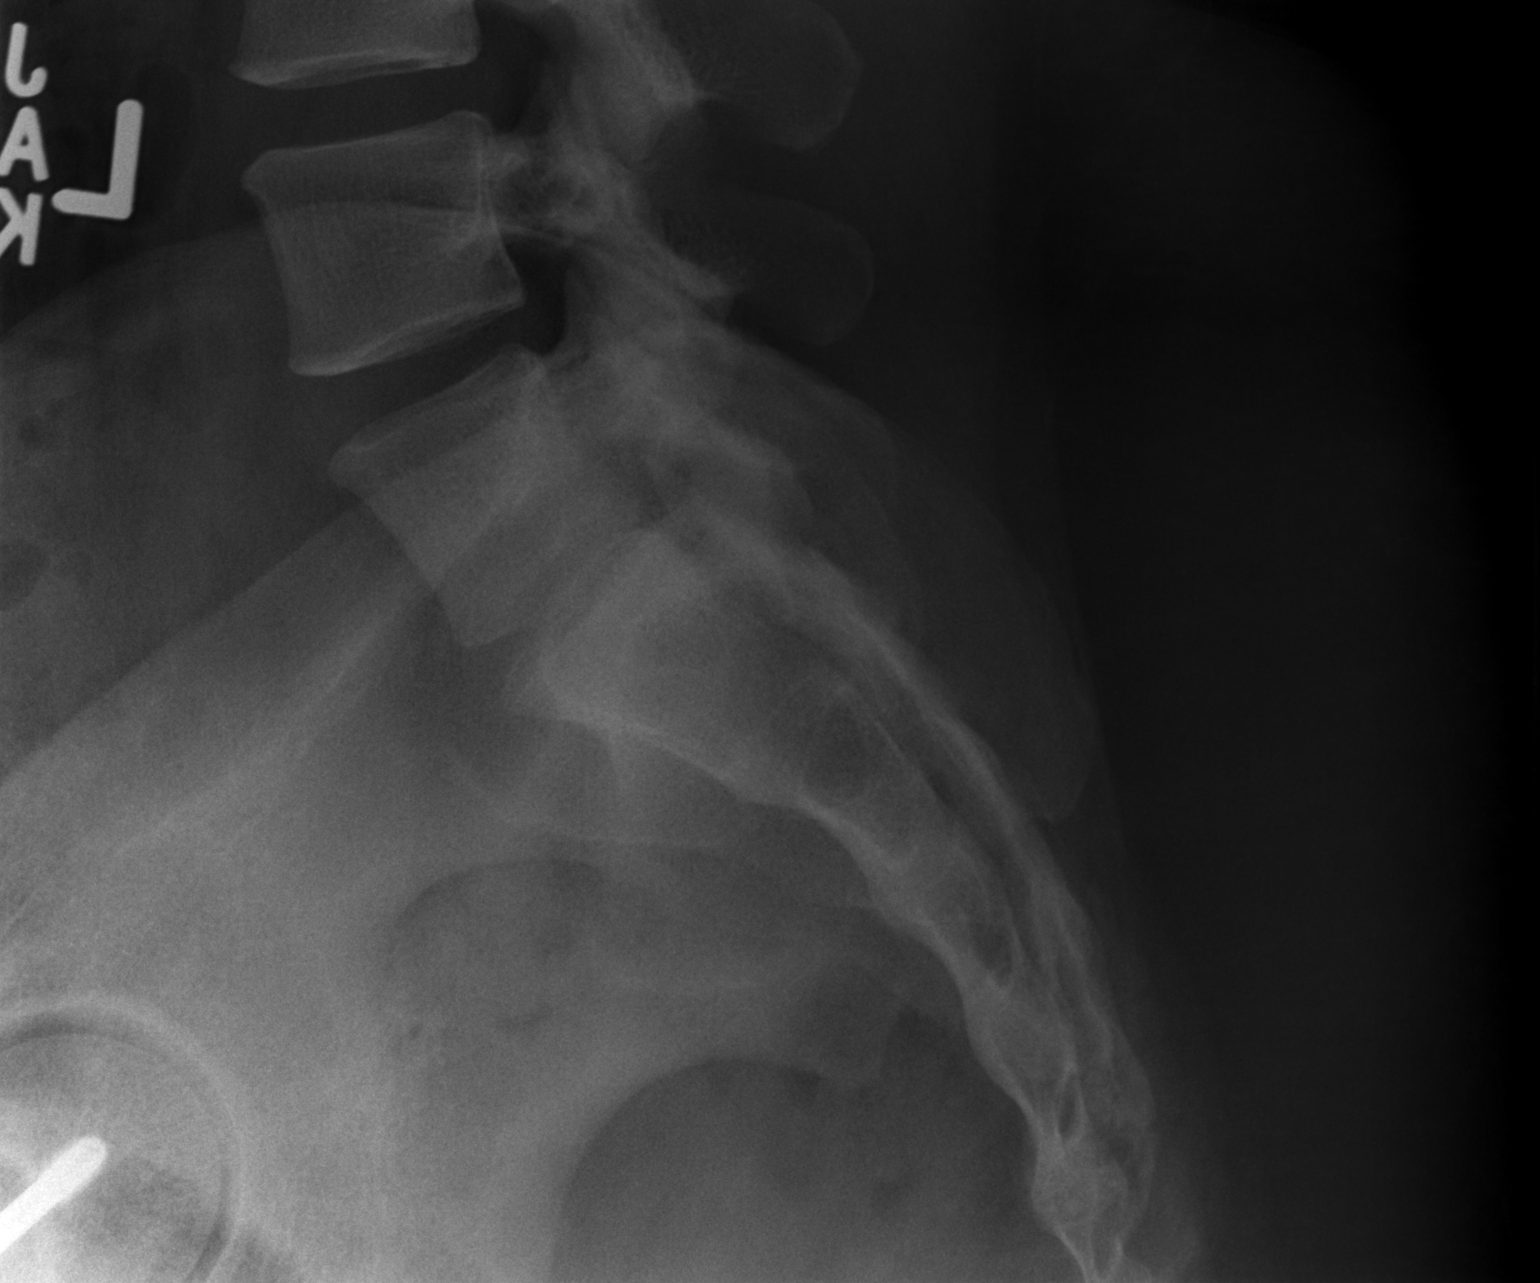

[3 of 3 positions shown; findings below may reference images not displayed]

FINDINGS: Six non rib-bearing lumbar vertebrae. Vertebral body alignment,
heights and disc space heights are normal. There is mild facet
arthropathy over the mid to lower lumbar spine. There is no
compression fracture or subluxation.
IMPRESSION: No acute findings.

Facet arthropathy.

## 2017-04-20 DIAGNOSIS — R0602 Shortness of breath: Secondary | ICD-10-CM | POA: Diagnosis not present

## 2017-04-20 DIAGNOSIS — R42 Dizziness and giddiness: Secondary | ICD-10-CM | POA: Diagnosis not present

## 2017-04-20 DIAGNOSIS — D649 Anemia, unspecified: Secondary | ICD-10-CM | POA: Diagnosis not present

## 2017-04-20 DIAGNOSIS — D509 Iron deficiency anemia, unspecified: Secondary | ICD-10-CM | POA: Diagnosis not present

## 2017-04-20 DIAGNOSIS — N920 Excessive and frequent menstruation with regular cycle: Secondary | ICD-10-CM | POA: Diagnosis not present

## 2017-05-10 DIAGNOSIS — Z01419 Encounter for gynecological examination (general) (routine) without abnormal findings: Secondary | ICD-10-CM | POA: Diagnosis not present

## 2017-06-26 DIAGNOSIS — H5000 Unspecified esotropia: Secondary | ICD-10-CM | POA: Diagnosis not present

## 2017-06-26 DIAGNOSIS — H532 Diplopia: Secondary | ICD-10-CM | POA: Diagnosis not present

## 2017-06-26 DIAGNOSIS — H4921 Sixth [abducent] nerve palsy, right eye: Secondary | ICD-10-CM | POA: Diagnosis not present

## 2017-06-30 DIAGNOSIS — H4920 Sixth [abducent] nerve palsy, unspecified eye: Secondary | ICD-10-CM | POA: Diagnosis not present

## 2017-07-04 ENCOUNTER — Encounter: Payer: Self-pay | Admitting: Neurology

## 2017-07-04 ENCOUNTER — Ambulatory Visit: Payer: 59 | Admitting: Neurology

## 2017-07-04 VITALS — BP 136/82 | HR 64 | Ht 66.0 in | Wt 297.0 lb

## 2017-07-04 DIAGNOSIS — Z6841 Body Mass Index (BMI) 40.0 and over, adult: Secondary | ICD-10-CM

## 2017-07-04 DIAGNOSIS — H4921 Sixth [abducent] nerve palsy, right eye: Secondary | ICD-10-CM | POA: Diagnosis not present

## 2017-07-04 DIAGNOSIS — G4719 Other hypersomnia: Secondary | ICD-10-CM | POA: Diagnosis not present

## 2017-07-04 DIAGNOSIS — H532 Diplopia: Secondary | ICD-10-CM | POA: Diagnosis not present

## 2017-07-04 DIAGNOSIS — R351 Nocturia: Secondary | ICD-10-CM | POA: Diagnosis not present

## 2017-07-04 DIAGNOSIS — R0683 Snoring: Secondary | ICD-10-CM | POA: Diagnosis not present

## 2017-07-04 DIAGNOSIS — Z820 Family history of epilepsy and other diseases of the nervous system: Secondary | ICD-10-CM | POA: Diagnosis not present

## 2017-07-04 NOTE — Progress Notes (Signed)
Subjective:    Patient ID: Brenda Johns is a 37 y.o. female.  HPI     Brenda Johns [address] [address] [place], [place] [address]  Dear Dr. Eulas Johns,   Saw your patient, Brenda Johns, upon your kind request in my neurologic clinic today for initial consultation of her blurry vision and sixth nerve palsy on the right side. The patient is unaccompanied today. As you know, Brenda Johns is a 37 year old right-handed woman with an underlying medical history of obesity with BMI of over 45, who reports intermittent double vision and blurry vision. I reviewed your office note from 06/26/2017. 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 She was advised to proceed with a brain MRI as well as orbital MRI. She had brain and orbital MRI with and without contrast on 06/30/2017 which showed: Impression: Normal MRI of the brain and orbits. She had no evidence of papilledema upon your exam. She reports that she is actually improving. She has had some blurry vision and double vision but feels like this has almost gone away. She does have a follow-up appointment for a recheck on the eye exam next month, she did not have a full dilated exam at the time of her last visit. She has had a visual field test in the past but not recently. She had one episode of significant headache in November, she took some over-the-counter Advil and went to sleep, improved after that. She has no actual history of migraine headaches, no family history of myasthenia, no personal history of muscle weakness, numbness or tingling. She has no history of ptosis. She has a history of lazy eye as a child. Brenda Johns has migraine headaches, Brenda Johns has obstructive sleep apnea. Of note, she does snore and does not always wake up rested, she endorses some daytime tiredness, nocturia about once per average night, bedtime between 9 and 9:30 PM, right time around 6. She has reduced and almost eliminated her  caffeine intake, drinks alcohol very rarely, has not had any recent change in her prescription eyeglasses. Her Epworth sleepiness score is 5 out of 24, fatigue score is 15 out of 63.  Her Past Medical History Is Significant For: No past medical history on file.  Her Past Surgical History Is Significant For: Past Surgical History:  Procedure Laterality Date  . DERMOID CYST  EXCISION    . MR HIPS BILATERAL      Her Family History Is Significant For: Family History  Problem Relation Johns of Onset  . Thyroid disease Brenda Johns   . Hypertension Brenda Johns   . Diverticulosis Brenda Johns     Her Social History Is Significant For: Social History   Socioeconomic History  . Marital status: Married    Spouse name: None  . Number of children: None  . Years of education: None  . Highest education level: None  Social Needs  . Financial resource strain: None  . Food insecurity - worry: None  . Food insecurity - inability: None  . Transportation needs - medical: None  . Transportation needs - non-medical: None  Occupational History  . None  Tobacco Use  . Smoking status: Never Smoker  . Smokeless tobacco: Never Used  Substance and Sexual Activity  . Alcohol use: Yes  . Drug use: No  . Sexual activity: None  Other Topics Concern  . None  Social History Narrative  . None    Her Allergies Are:  No Known Allergies:  Her Current Medications Are:  Outpatient Encounter Medications as of 07/04/2017  Medication Sig  . IRON PO Take by mouth.  Marland Kitchen NUVARING 0.12-0.015 MG/24HR vaginal ring   . Vitamin D, Ergocalciferol, (DRISDOL) 50000 units CAPS capsule   . [DISCONTINUED] chlorpheniramine-HYDROcodone (TUSSIONEX PENNKINETIC ER) 10-8 MG/5ML LQCR Take 5 mLs by mouth every 12 (twelve) hours as needed. For cough  . [DISCONTINUED] guaiFENesin (MUCINEX) 600 MG 12 hr tablet Take 1,200 mg by mouth 2 (two) times daily.  . [DISCONTINUED] ipratropium (ATROVENT) 0.06 % nasal spray Place 2 sprays into the nose 4  (four) times daily.  . [DISCONTINUED] medroxyPROGESTERone (PROVERA) 10 MG tablet Take 1 tablet (10 mg total) by mouth daily.  . [DISCONTINUED] OVER THE COUNTER MEDICATION Sinus medicine, nose spray   No facility-administered encounter medications on file as of 07/04/2017.   :  Review of Systems:  Out of a complete 14 point review of systems, all are reviewed and negative with the exception of these symptoms as listed below: Review of Systems  Neurological:       Pt presents today to discuss her vision and a possible work up for MG. Pt has blurry vision when looking to the right. Pt was told her MRI/ MRA head and orbits was clear.  Epworth Sleepiness Scale 0= would never doze 1= slight chance of dozing 2= moderate chance of dozing 3= high chance of dozing  Sitting and reading: 0 Watching TV: 2 Sitting inactive in a public place (ex. Theater or meeting): 0 As a passenger in a car for an hour without a break: 0 Lying down to rest in the afternoon: 3 Sitting and talking to someone: 0 Sitting quietly after lunch (no alcohol): 0 In a car, while stopped in traffic: 0 Total: 5     Objective:  Neurological Exam  Physical Exam Physical Examination:   Vitals:   07/04/17 0957  BP: 136/82  Pulse: 64   General Examination: The patient is a very pleasant 37 y.o. female in no acute distress. She appears well-developed and well-nourished and well groomed.   HEENT: Normocephalic, atraumatic, pupils are equal, round and reactive to light and accommodation. Funduscopic exam is normal with sharp disc margins noted. Extraocular tracking is good without limitation to gaze excursion or nystagmus noted. Normal smooth pursuit is noted. Hearing is grossly intact. Face is symmetric with normal facial animation and normal facial sensation. Speech is clear with no dysarthria noted. There is no hypophonia. There is no lip, neck/head, jaw or voice tremor. Neck is supple with full range of passive and  active motion. There are no carotid bruits on auscultation. Oropharynx exam reveals: mild mouth dryness, good dental hygiene and mild airway crowding, due to smaller airway entry, tonsils are 1+, redundant soft palate, uvula is actually on the small side, tongue is normal. Mallampati is class I. Neck circumference is 16 inches.   Chest: Clear to auscultation without wheezing, rhonchi or crackles noted.  Heart: S1+S2+0, regular and normal without murmurs, rubs or gallops noted.   Abdomen: Soft, non-tender and non-distended with normal bowel sounds appreciated on auscultation.  Extremities: There is trace pitting edema in the distal lower extremities bilaterally. Pedal pulses are intact.  Skin: Warm and dry without trophic changes noted.  Musculoskeletal: exam reveals no obvious joint deformities, tenderness or joint swelling or erythema.   Neurologically:  Mental status: The patient is awake, alert and oriented in all 4 spheres. Her immediate and remote memory, attention, language skills and fund of knowledge are  appropriate. There is no evidence of aphasia, agnosia, apraxia or anomia. Speech is clear with normal prosody and enunciation. Thought process is linear. Mood is normal and affect is normal.  Cranial nerves II - XII are as described above under HEENT exam. In addition: shoulder shrug is normal with equal shoulder height noted. Motor exam: Normal bulk, strength and tone is noted. There is no drift, tremor or rebound. Romberg is negative. Reflexes are 2+ throughout. Babinski: Toes are flexor bilaterally. Fine motor skills and coordination: intact with normal finger taps, normal hand movements, normal rapid alternating patting, normal foot taps and normal foot agility.  Cerebellar testing: No dysmetria or intention tremor on finger to nose testing. There is no truncal or gait ataxia.  Sensory exam: intact to light touch, pinprick, vibration, temperature sense in the upper and lower  extremities.  Gait, station and balance: She stands easily. No veering to one side is noted. No leaning to one side is noted. Posture is Johns-appropriate and stance is narrow based. Gait shows normal stride length and normal pace. No problems turning are noted. Tandem walk is unremarkable.   Assessment and Plan:   In summary, SHYE DOTY is a very pleasant 37 y.o.-year old female with an underlying medical history of obesity with BMI of over 45, who presents for neurologic consultation of a new onset blurry vision with evidence of right sixth nerve palsy noted on my exam. On my neurological examination she has benign findings, no evidence of weakness or ptosis, her symptoms are improving. Nevertheless, we can certainly proceed with blood work for myasthenia gravis panel. She is encouraged to keep her appointment for follow-up eye examination with dilated eyes and full visual field testing. Her recent brain MRI and orbital MRI results were benign which is also reassuring.  As far as further diagnostic testing is concerned, I suggested the following today: Blood work for myasthenia panel, we will also order sleep study to rule out sleep apnea as she has risk factors for sleep apnea and reports nonrestorative sleep, snoring, is significantly obese, reports some daytime tiredness and a family history of sleep apnea and therefore may benefit from evaluation and treatment of this. I will see her back after her test results are available and sleep study is completed. I answered all her questions today and she was in agreement. Thank you very much for allowing me to participate in the care of this nice patient. If I can be of any further assistance to you please do not hesitate to call me at [phone].  Sincerely,   Brenda Age, MD, PhD

## 2017-07-04 NOTE — Patient Instructions (Addendum)
Thankfully your MRI brain and orbits were unremarkable. Your neurological exam is unremarkable. Nevertheless, I will initiate blood work to screen for myasthenia gravis to be on the safe side. Please make sure you have the repeat full eye exam with dilated pupils and full visual field testing next month.  We will do sleep study evaluation to help rule out sleep apnea.

## 2017-07-05 ENCOUNTER — Telehealth: Payer: Self-pay

## 2017-07-05 DIAGNOSIS — R0683 Snoring: Secondary | ICD-10-CM

## 2017-07-05 NOTE — Telephone Encounter (Signed)
HST order placed. 

## 2017-07-05 NOTE — Telephone Encounter (Signed)
[employer] denied in lab sleep study, need HST order 

## 2017-07-09 LAB — MYASTHENIA GRAVIS FULL PANEL
AChR Binding Ab, Serum: <0.03 nmol/L (ref 0.00–0.24)
Acetylchol Block Ab: 15 % (ref 0–25)
Acetylcholine Modulat Ab: <12 % (ref 0–20)
Anti-striation Abs: NEGATIVE

## 2017-07-10 ENCOUNTER — Telehealth: Payer: Self-pay

## 2017-07-10 NOTE — Telephone Encounter (Signed)
I called pt, I advised her that her blood work was negative for MG, and asked her to proceed with sleep study testing. Pt asked me to send her results to Dr. Eulas Post at [facility]. Pt verbalized understanding of results. Pt had no questions at this time but was encouraged to call back if questions arise.

## 2017-07-10 NOTE — Telephone Encounter (Signed)
-----   Message from Star Age, MD sent at 07/10/2017  7:42 AM EST ----- Blood work for MG was neg. Pls notify pt. She should be scheduled for PSG or HST.  sa

## 2017-07-10 NOTE — Progress Notes (Signed)
Blood work for MG was neg. Pls notify pt. She should be scheduled for PSG or HST.  sa

## 2017-07-20 ENCOUNTER — Telehealth: Payer: Self-pay

## 2017-07-20 NOTE — Telephone Encounter (Signed)
Called pt on 2/28 to schedule for HST appt.  Pt stated that she will call us back to schedule when she verifies her cost from insurance company.  Attempted to schedule for 2nd time on 3/12.  Pt stated she has not checked with insurance yet and will call us back when she does.

## 2017-07-20 NOTE — Telephone Encounter (Signed)
Noted  

## 2017-07-24 DIAGNOSIS — H4921 Sixth [abducent] nerve palsy, right eye: Secondary | ICD-10-CM | POA: Diagnosis not present

## 2017-07-24 DIAGNOSIS — H532 Diplopia: Secondary | ICD-10-CM | POA: Diagnosis not present

## 2017-07-24 DIAGNOSIS — H5000 Unspecified esotropia: Secondary | ICD-10-CM | POA: Diagnosis not present

## 2017-10-27 DIAGNOSIS — N920 Excessive and frequent menstruation with regular cycle: Secondary | ICD-10-CM | POA: Diagnosis not present

## 2017-10-27 DIAGNOSIS — N946 Dysmenorrhea, unspecified: Secondary | ICD-10-CM | POA: Diagnosis not present

## 2017-11-16 DIAGNOSIS — N920 Excessive and frequent menstruation with regular cycle: Secondary | ICD-10-CM | POA: Diagnosis not present

## 2017-11-16 DIAGNOSIS — N946 Dysmenorrhea, unspecified: Secondary | ICD-10-CM | POA: Diagnosis not present

## 2017-11-16 DIAGNOSIS — D259 Leiomyoma of uterus, unspecified: Secondary | ICD-10-CM | POA: Diagnosis not present

## 2019-07-13 ENCOUNTER — Ambulatory Visit: Payer: 59

## 2020-01-08 ENCOUNTER — Other Ambulatory Visit: Payer: Self-pay

## 2020-06-29 ENCOUNTER — Other Ambulatory Visit (HOSPITAL_COMMUNITY): Payer: Self-pay | Admitting: Internal Medicine

## 2020-06-30 ENCOUNTER — Other Ambulatory Visit (HOSPITAL_COMMUNITY): Payer: Self-pay | Admitting: Physician Assistant

## 2020-06-30 MED FILL — MOLNUPIRAVIR 200 MG CAPS: 200 | 5 days supply | Qty: 40 | Fill #0

## 2021-04-19 ENCOUNTER — Emergency Department (HOSPITAL_COMMUNITY): Payer: 59

## 2021-04-19 ENCOUNTER — Encounter (HOSPITAL_COMMUNITY): Payer: Self-pay

## 2021-04-19 ENCOUNTER — Emergency Department (HOSPITAL_COMMUNITY)
Admission: EM | Admit: 2021-04-19 | Discharge: 2021-04-19 | Disposition: A | Discharge status: Home / Self Care | Payer: 59 | Attending: Emergency Medicine | Admitting: Emergency Medicine

## 2021-04-19 ENCOUNTER — Other Ambulatory Visit: Payer: Self-pay

## 2021-04-19 DIAGNOSIS — R5383 Other fatigue: Secondary | ICD-10-CM | POA: Insufficient documentation

## 2021-04-19 DIAGNOSIS — I10 Essential (primary) hypertension: Secondary | ICD-10-CM | POA: Insufficient documentation

## 2021-04-19 DIAGNOSIS — R079 Chest pain, unspecified: Secondary | ICD-10-CM | POA: Insufficient documentation

## 2021-04-19 HISTORY — DX: Essential (primary) hypertension: I10

## 2021-04-19 LAB — CBC
HCT: 31 % — ABNORMAL LOW (ref 36.0–46.0)
Hemoglobin: 8.6 g/dL — ABNORMAL LOW (ref 12.0–15.0)
MCH: 20 pg — ABNORMAL LOW (ref 26.0–34.0)
MCHC: 27.7 g/dL — ABNORMAL LOW (ref 30.0–36.0)
MCV: 72.3 fL — ABNORMAL LOW (ref 80.0–100.0)
Platelets: 130 10*3/uL — ABNORMAL LOW (ref 150–400)
RBC: 4.29 MIL/uL (ref 3.87–5.11)
RDW: 19.9 % — ABNORMAL HIGH (ref 11.5–15.5)
WBC: 5.1 10*3/uL (ref 4.0–10.5)
nRBC: 0 % (ref 0.0–0.2)

## 2021-04-19 LAB — BASIC METABOLIC PANEL
Anion gap: 7 (ref 5–15)
BUN: 8 mg/dL (ref 6–20)
CO2: 24 mmol/L (ref 22–32)
Calcium: 8.4 mg/dL — ABNORMAL LOW (ref 8.9–10.3)
Chloride: 108 mmol/L (ref 98–111)
Creatinine, Ser: 0.68 mg/dL (ref 0.44–1.00)
GFR, Estimated: >60 mL/min (ref >60)
Glucose, Bld: 87 mg/dL (ref 70–99)
Potassium: 4.1 mmol/L (ref 3.5–5.1)
Sodium: 139 mmol/L (ref 135–145)

## 2021-04-19 LAB — I-STAT BETA HCG BLOOD, ED (MC, WL, AP ONLY): I-stat hCG, quantitative: <5 m[IU]/mL (ref <5)

## 2021-04-19 LAB — TROPONIN I (HIGH SENSITIVITY): Troponin I (High Sensitivity): 9 ng/L (ref <18)

## 2021-04-19 NOTE — ED Triage Notes (Signed)
Patient c/o right chest pain and states the pain was shooting across her chest last night. Pain subsided and then when waking this AM she began having right chest pain again and c/o nausea. Patient denies any SOB,

## 2021-04-19 NOTE — ED Provider Notes (Addendum)
[facility] DEPT Provider Note   CSN: [id number] Arrival date & time: 04/19/21  4801     History Chief Complaint  Patient presents with   Chest Pain    Brenda Johns is a 39 y.o. female with history of iron deficiency anemia presents the emergency department complaining of chest pain.  Patient states that she had acute onset of chest pain starting last night at 5 PM, and is mostly in the left side of her chest as well as her left posterior shoulder.  She said it felt a pressure pain with intermittent sharp pains.  She said the pain lasted for about 3 hours before she went to bed, and mostly subsided by then.  When she woke up this morning she did have some residual pain, but was not as bad as it was yesterday.  She is currently not experiencing any pain while in the emergency department.   Chest Pain Associated symptoms: fatigue   Associated symptoms: no abdominal pain, no back pain, no cough, no fever, no nausea, no shortness of breath and no vomiting       Past Medical History:  Diagnosis Date   Hypertension     There are no problems to display for this patient.   Past Surgical History:  Procedure Laterality Date   DERMOID CYST  EXCISION     MR HIPS BILATERAL       OB History     Gravida  3   Para  2   Term      Preterm      AB  1   Living  2      SAB      IAB  1   Ectopic      Multiple      Live Births              Family History  Problem Relation Age of Onset   Thyroid disease Mother    Hypertension Father    Diverticulosis Father     Social History   Tobacco Use   Smoking status: Never   Smokeless tobacco: Never  Vaping Use   Vaping Use: Never used  Substance Use Topics   Alcohol use: Yes   Drug use: No    Home Medications Prior to Admission medications   Medication Sig Start Date End Date Taking? Authorizing Provider  IRON PO Take by mouth.    [provider]  Molnupiravir 200 MG  CAPS TAKE 4 CAPSULES BY MOUTH EVERY 12 HOURS FOR 5 DAYS. 06/30/20 06/30/21  Thayer Ohm [place], [place]  NUVARING 0.12-0.015 MG/24HR vaginal ring  05/25/17   [provider]  PAXLOVID 20 x 150 MG & 10 x 100MG  TBPK TAKE AS DIRECTED TWICE DAILY FOR 5 DAYS 06/29/20 06/29/21  Sabra Heck, [place] E, PA  Vitamin D, Ergocalciferol, (DRISDOL) 50000 units CAPS capsule  05/17/17   [provider]    Allergies    Patient has no known allergies.  Review of Systems   Review of Systems  Constitutional:  Positive for fatigue. Negative for chills and fever.  HENT:  Negative for congestion.   Respiratory:  Negative for cough and shortness of breath.   Cardiovascular:  Positive for chest pain.  Gastrointestinal:  Negative for abdominal pain, constipation, diarrhea, nausea and vomiting.  Genitourinary:  Negative for dysuria and flank pain.  Musculoskeletal:  Negative for back pain.  All other systems reviewed and are negative.  Physical Exam Updated Vital Signs  BP (!) 159/106 (BP Location: Left Arm)   Pulse 90   Temp 99.2 F (37.3 C) (Oral)   Resp 18   Ht 5\' 7"  (1.702 m)   Wt 127 kg   LMP 04/18/2021   SpO2 100%   BMI 43.85 kg/m   Physical Exam Vitals and nursing note reviewed.  Constitutional:      Appearance: Normal appearance.  HENT:     Head: Normocephalic and atraumatic.  Eyes:     Conjunctiva/sclera: Conjunctivae normal.  Cardiovascular:     Rate and Rhythm: Normal rate and regular rhythm.  Pulmonary:     Effort: Pulmonary effort is normal. No respiratory distress.     Breath sounds: Normal breath sounds.  Abdominal:     General: There is no distension.     Palpations: Abdomen is soft.     Tenderness: There is no abdominal tenderness.  Skin:    General: Skin is warm and dry.  Neurological:     General: No focal deficit present.     Mental Status: She is alert.    ED Results / Procedures / Treatments   Labs (all labs ordered are listed, but only abnormal results are  displayed) Labs Reviewed  BASIC METABOLIC PANEL - Abnormal; Notable for the following components:      Result Value   Calcium 8.4 (*)    All other components within normal limits  CBC - Abnormal; Notable for the following components:   Hemoglobin 8.6 (*)    HCT 31.0 (*)    MCV 72.3 (*)    MCH 20.0 (*)    MCHC 27.7 (*)    RDW 19.9 (*)    Platelets 130 (*)    All other components within normal limits  I-STAT BETA HCG BLOOD, ED ([facility], [facility], [facility] ONLY)  TROPONIN I (HIGH SENSITIVITY)  TROPONIN I (HIGH SENSITIVITY)    EKG EKG Interpretation  Date/Time:  Monday April 19 2021 09:48:23 EST Ventricular Rate:  77 PR Interval:  163 QRS Duration: 93 QT Interval:  373 QTC Calculation: 423 R Axis:   7 Text Interpretation: Sinus rhythm Within normal limits Confirmed by Blanchie Dessert [phone]) on 04/19/2021 1:13:58 PM  Radiology DG Chest 2 View  Result Date: 04/19/2021 CLINICAL DATA:  Chest pain. EXAM: CHEST - 2 VIEW COMPARISON:  None. FINDINGS: The heart size and mediastinal contours are within normal limits. Both lungs are clear. The visualized skeletal structures are unremarkable. IMPRESSION: No active cardiopulmonary disease. Electronically Signed   By: Titus Dubin M.D.   On: 04/19/2021 10:34    Procedures Procedures   Medications Ordered in ED Medications - No data to display  ED Course  I have reviewed the triage vital signs and the nursing notes.  Pertinent labs & imaging results that were available during my care of the patient were reviewed by me and considered in my medical decision making (see chart for details).    MDM Rules/Calculators/A&P                           Patient is 40 year old female with history of iron deficiency anemia who presents to the emergency department complaining of cute onset of chest pain last night.  On my evaluation patient is afebrile, not tachycardic, not hypoxic, no acute distress.  She does endorse some mild fatigue in the past  several months, she has not been taking her iron supplementation for her anemia.  Lab work performed today which is  mostly unremarkable except for hemoglobin of 8.5.  In the setting of known iron deficiency anemia and recent medication noncompliance, I do not feel this requires further work-up or necessitates blood transfusion.  Clinically patient appears well, she is nontoxic, and I do not believe she requires admission or inpatient treatment for her symptoms at this time. Patient is to be discharged with recommendation to follow up with PCP in regards to today's hospital visit. Chest pain is not likely of cardiac or pulmonary etiology d/t presentation, PERC negative, VSS, no tracheal deviation, no JVD or new murmur, RRR, breath sounds equal bilaterally, EKG without acute abnormalities, negative troponin, and negative CXR. Heart score for major cardiac events = 2. Pt has been advised to return to the ED if CP becomes exertional, associated with diaphoresis or nausea, radiates to left jaw/arm, worsens or becomes concerning in any way. Pt appears reliable for follow up and is agreeable to discharge.   Case has been discussed with and seen by Dr. Maryan Rued who agrees with the above plan to discharge.    Final Clinical Impression(s) / ED Diagnoses Final diagnoses:  Chest pain, unspecified type    Rx / DC Orders ED Discharge Orders     None      Portions of this report may have been transcribed using voice recognition software. Every effort was made to ensure accuracy; however, inadvertent computerized transcription errors may be present.     Estill Cotta 04/19/21 1331    Blanchie Dessert, MD 04/25/21 [phone]

## 2021-04-19 NOTE — ED Provider Notes (Signed)
Emergency Medicine Provider Triage Evaluation Note  Brenda Johns , a 40 y.o. female  was evaluated in triage.  Pt complains of acute onset of chest pain starting last night at 5 PM.  Patient states that she had chest pain mostly in the left side of her chest as well as her left posterior shoulder, with intermittent sharp pains.  She said that the pain lasted for about 3 hours before she went to bed and had mostly subsided.  When she woke up this morning she continued to have some residual pain, but was not as bad as yesterday.  She is not feeling any pain at this moment.  Review of Systems  Positive: Chest pain, nausea, headache Negative: Fever, chills, shortness of breath, vomiting  Physical Exam  BP (!) 161/91 (BP Location: Left Arm)   Pulse 83   Temp 99 F (37.2 C) (Oral)   Resp 18   Ht 5\' 7"  (1.702 m)   Wt 127 kg   LMP 04/18/2021   SpO2 98%   BMI 43.85 kg/m  Gen:   Awake, no distress   Resp:  Normal effort  MSK:   Moves extremities without difficulty  Other:    Medical Decision Making  Medically screening exam initiated at 11:02 AM.  Appropriate orders placed.  Brenda Johns was informed that the remainder of the evaluation will be completed by another provider, this initial triage assessment does not replace that evaluation, and the importance of remaining in the ED until their evaluation is complete.     Estill Cotta 04/19/21 1103    Blanchie Dessert, MD 04/25/21 [phone]

## 2021-04-19 NOTE — Discharge Instructions (Addendum)
You are seen in the emergency department today for chest pain.  As we discussed your lab work all looked great today. However, your hemoglobin level was low at 8.5. The pharmacist recommended you try iron polysaccharide, as this is a formulation that is more gentle on your stomach.  I would also recommend following up with your primary doctor for long-term management of your iron deficiency anemia.  Continue to monitor how you are doing and return to the emergency department for any new or worsening chest pain, difficulty breathing, feeling lightheaded or passing out.  It has been a pleasure seeing and caring for you today and I hope you start feeling better soon!

## 2022-03-18 ENCOUNTER — Encounter (HOSPITAL_COMMUNITY): Payer: Self-pay

## 2022-08-22 IMAGING — CR DG CHEST 2V
2 series · 2 of 2 positions shown · non-contrast
Comparison: None.

CLINICAL DATA: Chest pain.

EXAM:
CHEST - 2 VIEW

[w chest pa]
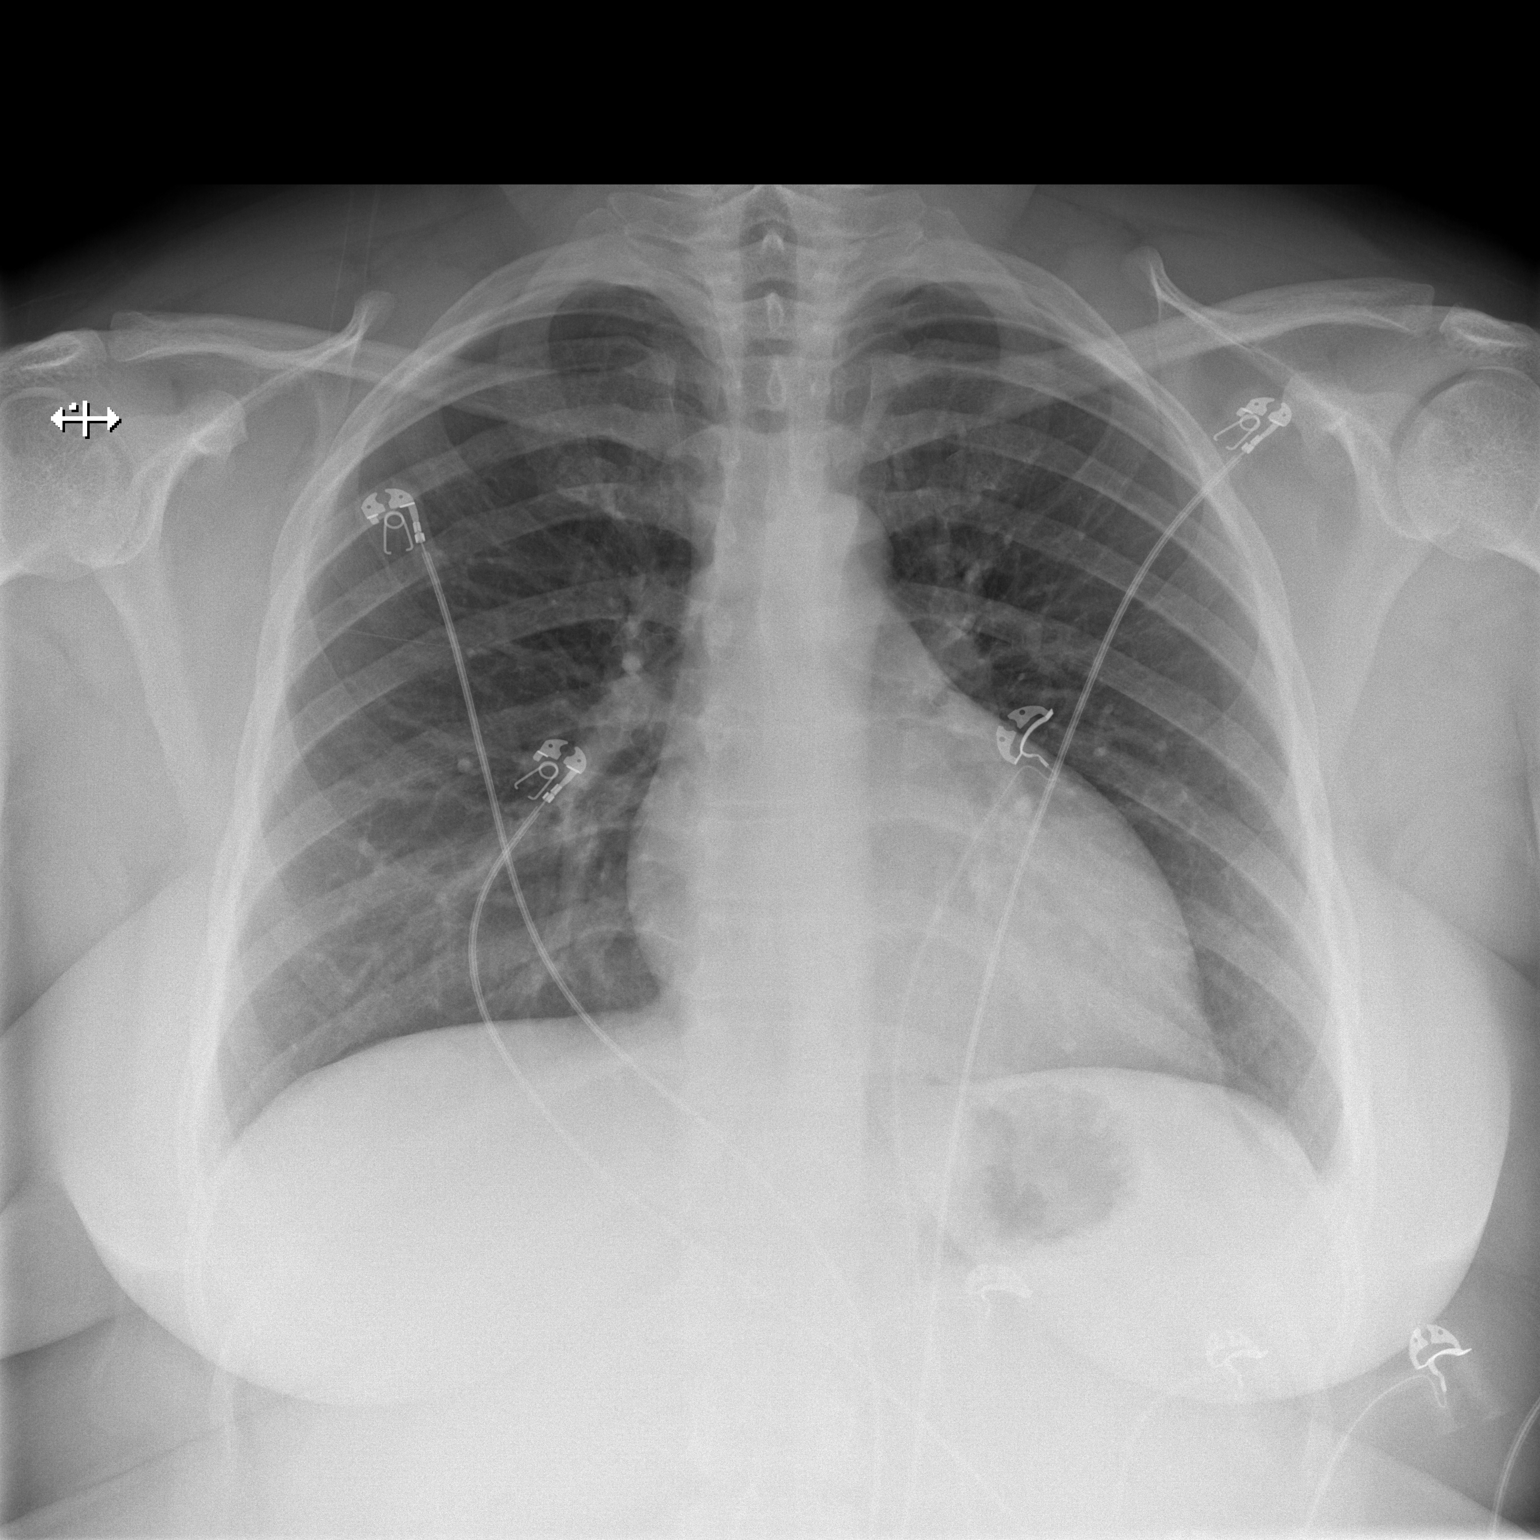

[w chest lat]
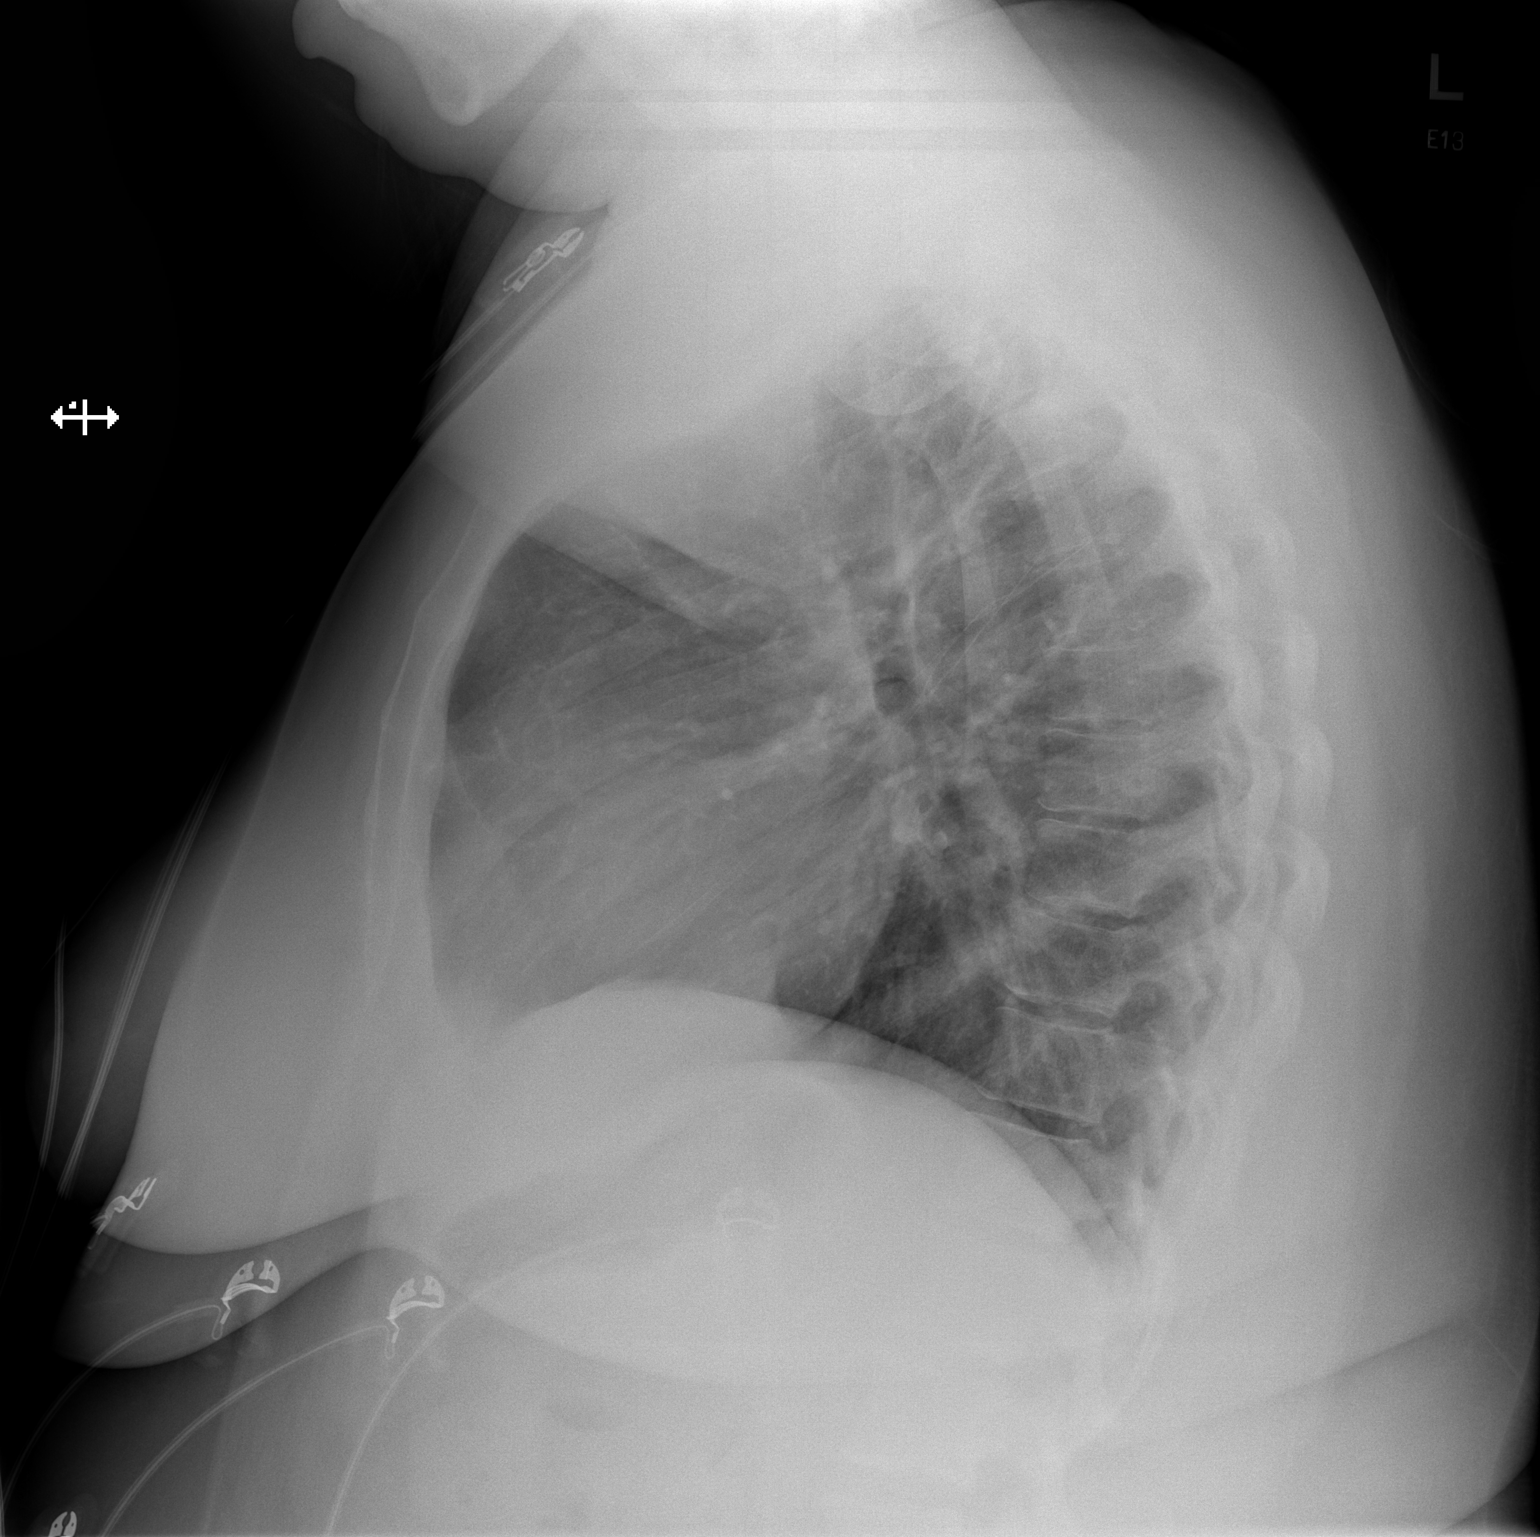

[2 of 2 positions shown; findings below may reference images not displayed]

FINDINGS: The heart size and mediastinal contours are within normal limits.
Both lungs are clear. The visualized skeletal structures are
unremarkable.
IMPRESSION: No active cardiopulmonary disease.

## 2023-01-17 ENCOUNTER — Emergency Department (HOSPITAL_COMMUNITY)
Admission: EM | Admit: 2023-01-17 | Discharge: 2023-01-17 | Disposition: A | Discharge status: Home / Self Care | Payer: 59 | Attending: Emergency Medicine | Admitting: Emergency Medicine

## 2023-01-17 ENCOUNTER — Emergency Department (HOSPITAL_COMMUNITY): Payer: 59

## 2023-01-17 ENCOUNTER — Other Ambulatory Visit: Payer: Self-pay

## 2023-01-17 ENCOUNTER — Encounter (HOSPITAL_COMMUNITY): Payer: Self-pay

## 2023-01-17 DIAGNOSIS — R0602 Shortness of breath: Secondary | ICD-10-CM | POA: Diagnosis not present

## 2023-01-17 DIAGNOSIS — R000 Tachycardia, unspecified: Secondary | ICD-10-CM | POA: Insufficient documentation

## 2023-01-17 DIAGNOSIS — Z8616 Personal history of COVID-19: Secondary | ICD-10-CM | POA: Diagnosis not present

## 2023-01-17 DIAGNOSIS — M549 Dorsalgia, unspecified: Secondary | ICD-10-CM | POA: Insufficient documentation

## 2023-01-17 DIAGNOSIS — R051 Acute cough: Secondary | ICD-10-CM | POA: Diagnosis not present

## 2023-01-17 DIAGNOSIS — Z20822 Contact with and (suspected) exposure to covid-19: Secondary | ICD-10-CM | POA: Insufficient documentation

## 2023-01-17 DIAGNOSIS — R059 Cough, unspecified: Secondary | ICD-10-CM | POA: Diagnosis present

## 2023-01-17 LAB — CBC
HCT: 39.7 % (ref 36.0–46.0)
Hemoglobin: 12.7 g/dL (ref 12.0–15.0)
MCH: 27.5 pg (ref 26.0–34.0)
MCHC: 32 g/dL (ref 30.0–36.0)
MCV: 86.1 fL (ref 80.0–100.0)
Platelets: 310 10*3/uL (ref 150–400)
RBC: 4.61 MIL/uL (ref 3.87–5.11)
RDW: 13.7 % (ref 11.5–15.5)
WBC: 4 10*3/uL (ref 4.0–10.5)
nRBC: 0 % (ref 0.0–0.2)

## 2023-01-17 LAB — COMPREHENSIVE METABOLIC PANEL
ALT: 16 U/L (ref 0–44)
AST: 16 U/L (ref 15–41)
Albumin: 3.3 g/dL — ABNORMAL LOW (ref 3.5–5.0)
Alkaline Phosphatase: 56 U/L (ref 38–126)
Anion gap: 9 (ref 5–15)
BUN: 7 mg/dL (ref 6–20)
CO2: 21 mmol/L — ABNORMAL LOW (ref 22–32)
Calcium: 8.4 mg/dL — ABNORMAL LOW (ref 8.9–10.3)
Chloride: 108 mmol/L (ref 98–111)
Creatinine, Ser: 0.66 mg/dL (ref 0.44–1.00)
GFR, Estimated: >60 mL/min (ref >60)
Glucose, Bld: 86 mg/dL (ref 70–99)
Potassium: 3.6 mmol/L (ref 3.5–5.1)
Sodium: 138 mmol/L (ref 135–145)
Total Bilirubin: 0.3 mg/dL (ref 0.3–1.2)
Total Protein: 7.2 g/dL (ref 6.5–8.1)

## 2023-01-17 LAB — RESP PANEL BY RT-PCR (RSV, FLU A&B, COVID)  RVPGX2
Influenza A by PCR: NEGATIVE
Influenza B by PCR: NEGATIVE
Resp Syncytial Virus by PCR: NEGATIVE
SARS Coronavirus 2 by RT PCR: NEGATIVE

## 2023-01-17 LAB — HCG, SERUM, QUALITATIVE: Preg, Serum: NEGATIVE

## 2023-01-17 MED ORDER — SODIUM CHLORIDE (PF) 0.9 % IJ SOLN
INTRAMUSCULAR | Status: AC
  Filled 2023-01-17: qty 50

## 2023-01-17 MED ORDER — IOHEXOL 350 MG/ML SOLN
75.0000 mL | Freq: Once | INTRAVENOUS | Status: AC | PRN
  Administered 2023-01-17: 75 mL via INTRAVENOUS

## 2023-01-17 NOTE — ED Provider Notes (Signed)
[place] EMERGENCY DEPARTMENT AT [facility] Provider Note   CSN: [id number] Arrival date & time: 01/17/23  1257     History Chief Complaint  Patient presents with   Shortness of Breath   Covid Positive    Brenda Johns is a 42 y.o. female.  Patient with past history significant for hypertension presents emergency department concerns of shortness of breath.  Patient diagnosed with COVID-19 infection 2 days ago and has began to experience shortness of breath starting this morning as well as some back pain.  Patient was told by her primary care provider to come the emergency department for evaluation of possible blood clot.  Reports that symptoms are typically worsened with deep and elation of coughing.  Denies any recent fevers or chills currently denies any chest pain.  Denies any prior pulmonary history such as COPD or asthma.  Cannot find any specific alleviating or aggravating factors.   Shortness of Breath      Home Medications Prior to Admission medications   Medication Sig Start Date End Date Taking? Authorizing Provider  IRON PO Take by mouth.    [provider]  Lavella Lemons 0.12-0.015 MG/24HR vaginal ring  05/25/17   [provider]  Vitamin D, Ergocalciferol, (DRISDOL) 50000 units CAPS capsule  05/17/17   [provider]      Allergies    Patient has no known allergies.    Review of Systems   Review of Systems  Respiratory:  Positive for shortness of breath.   All other systems reviewed and are negative.   Physical Exam Updated Vital Signs BP (!) 158/114 (BP Location: Right Arm)   Pulse (!) 111   Temp 99.1 F (37.3 C) (Oral)   Resp 18   Ht 5\' 8"  (1.727 m)   Wt (!) 137.4 kg   LMP 01/17/2023   SpO2 100%   BMI 46.07 kg/m  Physical Exam Vitals and nursing note reviewed.  Constitutional:      General: She is not in acute distress.    Appearance: She is well-developed.  HENT:     Head: Normocephalic and atraumatic.  Eyes:      Conjunctiva/sclera: Conjunctivae normal.  Cardiovascular:     Rate and Rhythm: Regular rhythm. Tachycardia present.     Heart sounds: No murmur heard. Pulmonary:     Effort: Pulmonary effort is normal. No tachypnea or respiratory distress.     Breath sounds: Normal breath sounds. No decreased breath sounds, wheezing, rhonchi or rales.  Abdominal:     Palpations: Abdomen is soft.     Tenderness: There is no abdominal tenderness.  Musculoskeletal:        General: No swelling.     Cervical back: Neck supple.  Skin:    General: Skin is warm and dry.     Capillary Refill: Capillary refill takes less than 2 seconds.  Neurological:     Mental Status: She is alert.  Psychiatric:        Mood and Affect: Mood normal.     ED Results / Procedures / Treatments   Labs (all labs ordered are listed, but only abnormal results are displayed) Labs Reviewed  COMPREHENSIVE METABOLIC PANEL - Abnormal; Notable for the following components:      Result Value   CO2 21 (*)    Calcium 8.4 (*)    Albumin 3.3 (*)    All other components within normal limits  RESP PANEL BY RT-PCR (RSV, FLU A&B, COVID)  RVPGX2  CBC  HCG, SERUM, QUALITATIVE    EKG None  Radiology No results found.  Procedures Procedures   Medications Ordered in ED Medications  iohexol (OMNIPAQUE) 350 MG/ML injection 75 mL (75 mLs Intravenous Contrast Given 01/17/23 1451)    ED Course/ Medical Decision Making/ A&P                               Medical Decision Making Amount and/or Complexity of Data Reviewed Labs: ordered. Radiology: ordered.  Risk Prescription drug management.   This patient presents to the ED for concern of shortness of breath, COVID-positive.  Differential diagnosis includes pneumonia, bronchitis, COVID-19 infection, PE   Lab Tests:  I Ordered, and personally interpreted labs.  The pertinent results include: Markable, CMP with mild decline in calcium of 8.4 and slight drop in albumin at  3.3, hCG negative, respiratory viral panel negative   Imaging Studies ordered:  I ordered imaging studies including chest x-ray, CT angio chest I independently visualized and interpreted imaging which showed no acute cardiopulmonary process on xray, CT angio pending at handoff I agree with the radiologist interpretation   Problem List / ED Course:  Patient presented to the ED with concerns of shortness of breath. History of HTN. No history of DVT or PE, not on blood thinners, and currently denies leg swelling, hemoptysis, and recent surgery or trauma. Patient PERC negative. However, patient and PCP concerned for possible PE and requesting CT imaging given sudden onset of shortness of breath. Suspect that patient's symptoms could be a byproduct of recent covid diagnosis or possible developing pneumonia. Basic labs are thankfully reassuring without any acute findings noted with CBC, CMP, viral panel all pending reassuring and negative.  hCG serum is also negative.  Chest x-ray ordered for evaluation as well as CT angio chest. Patient currently stable appearing and does not appear that she is requiring new O2 or BiPAP as work of breathing unlabored.   3:33 PM Care of Gennaro Africa transferred to [facility] and Dr. Andria Meuse at the end of my shift as the patient will require reassessment once labs/imaging have resulted. Patient presentation, ED course, and plan of care discussed with review of all pertinent labs and imaging. Please see his/her note for further details regarding further ED course and disposition. Plan at time of handoff is await CT results to rule out possible PE or pneumonia. If negative, can discharge home with PCP follow up. This may be altered or completely changed at the discretion of the oncoming team pending results of further workup.   Final Clinical Impression(s) / ED Diagnoses Final diagnoses:  None    Rx / DC Orders ED Discharge Orders     None         Salomon Mast 01/17/23 1535    Margarita Grizzle, MD 01/21/23 1159

## 2023-01-17 NOTE — Discharge Instructions (Addendum)
Your covid test is negative here today. This is a PCR test and is usually accurate. The test your PCP took may have been a false positive. You may just have another viral illness. You may continue taking paxlovid, however, I do not think it is necessary.  You are also negative for flu and RSV.  However, please wear a mask and quarantine. You may discontinue when: - It has been at least 5 days have passed since symptoms 1st appeared. - AND have had resolution of fever without the use of fever-reducing medications for 24 hours  Please wash hands frequently and do not share eating utensils or dishware with others.  You may use over-the-counter medications as needed for symptom control.   Return to the ER if you have shortness of breath to where you cannot talk in full sentences, chest pain, any other new or concerning symptoms.  Follow-up with your PCP if symptoms do not start to improve within the next week.

## 2023-01-17 NOTE — ED Provider Notes (Signed)
SOB and tachy, sent over by PCP. R/o PE with CTA.  Physical Exam  BP (!) 158/114 (BP Location: Right Arm)   Pulse (!) 111   Temp 99.1 F (37.3 C) (Oral)   Resp 18   Ht 5\' 8"  (1.727 m)   Wt (!) 137.4 kg   LMP 01/17/2023   SpO2 100%   BMI 46.07 kg/m   Physical Exam Vitals and nursing note reviewed.  Constitutional:      General: She is not in acute distress.    Appearance: Normal appearance. She is not ill-appearing.  HENT:     Head: Atraumatic.  Cardiovascular:     Rate and Rhythm: Normal rate and regular rhythm.  Pulmonary:     Effort: Pulmonary effort is normal.     Breath sounds: Normal breath sounds.  Neurological:     General: No focal deficit present.     Mental Status: She is alert.  Psychiatric:        Mood and Affect: Mood normal.        Behavior: Behavior normal.     Procedures  Procedures  ED Course / MDM    Medical Decision Making Amount and/or Complexity of Data Reviewed Labs: ordered. Radiology: ordered.  Risk Prescription drug management.   I received patient as handoff from Lisette Abu PA-C  Patient was sent over by her PCP to rule out a PE.  She reports she was diagnosed with COVID at her PCP, however, COVID testing here is negative.  Patient noted some shortness of breath earlier at her PCP visit and was found to have slight tachycardia here on arrival.  I independently reviewed and interpreted her chest x-ray and CTA chest and agree with the radiology findings. Her chest x-ray reveals no signs of pneumonia, CTA with no signs of PE.  She is well-appearing on exam, regular rate and rhythm, lungs clear to auscultation bilaterally.  Denies shortness of breath but does endorse some pain in her chest wall when coughing.  Her PCP already prescribed her Paxlovid.  I discussed that she may continue taking this, but I do not feel like she needs to given her COVID test is negative and her symptoms are not very severe, not many co morbidities besides HTN.   She also is taking Sudafed at home for congestion.   I encouraged her to follow-up with her PCP for recheck in 1 week to ensure symptoms are improving. Return precautions given       Arabella Merles, PA-C 01/17/23 1638    Anders Simmonds T, DO 01/17/23 2218

## 2023-01-17 NOTE — ED Triage Notes (Addendum)
Pt diagnosed with covid on Sunday. This morning began having SOB and back pain. Pt was told by MD to come in for evaluation for blood clot. Pain is worse when coughing. Denies fever chills. Also endorses chest pain, central dull pain. Worse with coughing
